# Patient Record
Sex: Male | Born: 1960 | Race: White | Hispanic: No | Marital: Married | Smoking: Never smoker
Health system: Southern US, Community
[De-identification: ages and names within clinical notes are randomized; demographics above are authoritative.]

## PROBLEM LIST (undated history)

## (undated) DIAGNOSIS — I10 Essential (primary) hypertension: Secondary | ICD-10-CM

## (undated) DIAGNOSIS — K219 Gastro-esophageal reflux disease without esophagitis: Secondary | ICD-10-CM

## (undated) DIAGNOSIS — K429 Umbilical hernia without obstruction or gangrene: Secondary | ICD-10-CM

## (undated) DIAGNOSIS — D126 Benign neoplasm of colon, unspecified: Secondary | ICD-10-CM

## (undated) HISTORY — PX: WISDOM TOOTH EXTRACTION: SHX21

## (undated) HISTORY — DX: Umbilical hernia without obstruction or gangrene: K42.9

---

## 2008-09-08 ENCOUNTER — Ambulatory Visit: Payer: Self-pay | Admitting: Internal Medicine

## 2009-09-05 HISTORY — PX: CARDIAC CATHETERIZATION: SHX172

## 2015-07-10 ENCOUNTER — Emergency Department
Admission: EM | Admit: 2015-07-10 | Discharge: 2015-07-10 | Disposition: A | Discharge status: Home / Self Care | Payer: BLUE CROSS/BLUE SHIELD | Attending: Emergency Medicine | Admitting: Emergency Medicine

## 2015-07-10 ENCOUNTER — Emergency Department: Payer: BLUE CROSS/BLUE SHIELD

## 2015-07-10 ENCOUNTER — Encounter: Payer: Self-pay | Admitting: *Deleted

## 2015-07-10 DIAGNOSIS — K429 Umbilical hernia without obstruction or gangrene: Secondary | ICD-10-CM | POA: Diagnosis present

## 2015-07-10 LAB — COMPREHENSIVE METABOLIC PANEL
ALBUMIN: 4.4 g/dL (ref 3.5–5.0)
ALT: 29 U/L (ref 17–63)
AST: 19 U/L (ref 15–41)
Alkaline Phosphatase: 61 U/L (ref 38–126)
Anion gap: 9 (ref 5–15)
BILIRUBIN TOTAL: 0.6 mg/dL (ref 0.3–1.2)
BUN: 19 mg/dL (ref 6–20)
CHLORIDE: 102 mmol/L (ref 101–111)
CO2: 28 mmol/L (ref 22–32)
CREATININE: 0.89 mg/dL (ref 0.61–1.24)
Calcium: 9.2 mg/dL (ref 8.9–10.3)
GFR calc Af Amer: >60 mL/min (ref >60)
GLUCOSE: 97 mg/dL (ref 65–99)
Potassium: 4 mmol/L (ref 3.5–5.1)
Sodium: 139 mmol/L (ref 135–145)
Total Protein: 7.6 g/dL (ref 6.5–8.1)

## 2015-07-10 LAB — CBC
HEMATOCRIT: 49.6 % (ref 40.0–52.0)
Hemoglobin: 16.5 g/dL (ref 13.0–18.0)
MCH: 28.6 pg (ref 26.0–34.0)
MCHC: 33.3 g/dL (ref 32.0–36.0)
MCV: 86 fL (ref 80.0–100.0)
Platelets: 246 10*3/uL (ref 150–440)
RBC: 5.76 MIL/uL (ref 4.40–5.90)
RDW: 13.6 % (ref 11.5–14.5)
WBC: 7.5 10*3/uL (ref 3.8–10.6)

## 2015-07-10 LAB — LIPASE, BLOOD: LIPASE: 32 U/L (ref 11–51)

## 2015-07-10 MED ORDER — IOHEXOL 300 MG/ML  SOLN
100.0000 mL | Freq: Once | INTRAMUSCULAR | Status: AC | PRN
  Administered 2015-07-10: 100 mL via INTRAVENOUS
  Filled 2015-07-10: qty 100

## 2015-07-10 MED ORDER — IOHEXOL 240 MG/ML SOLN
25.0000 mL | Freq: Once | INTRAMUSCULAR | Status: AC | PRN
  Administered 2015-07-10: 25 mL via ORAL
  Filled 2015-07-10: qty 25

## 2015-07-10 MED ORDER — ONDANSETRON HCL 4 MG PO TABS: 4.0000 mg | ORAL_TABLET | Freq: Every day | ORAL | Status: DC | PRN

## 2015-07-10 NOTE — Discharge Instructions (Signed)
Hernia, Adult A hernia is the bulging of an organ or tissue through a weak spot in the muscles of the abdomen (abdominal wall). Hernias develop most often near the navel or groin. There are many kinds of hernias. Common kinds include:  Femoral hernia. This kind of hernia develops under the groin in the upper thigh area.  Inguinal hernia. This kind of hernia develops in the groin or scrotum.  Umbilical hernia. This kind of hernia develops near the navel.  Hiatal hernia. This kind of hernia causes part of the stomach to be pushed up into the chest.  Incisional hernia. This kind of hernia bulges through a scar from an abdominal surgery. CAUSES This condition may be caused by:  Heavy lifting.  Coughing over a long period of time.  Straining to have a bowel movement.  An incision made during an abdominal surgery.  A birth defect (congenital defect).  Excess weight or obesity.  Smoking.  Poor nutrition.  Cystic fibrosis.  Excess fluid in the abdomen.  Undescended testicles. SYMPTOMS Symptoms of a hernia include:  A lump on the abdomen. This is the first sign of a hernia. The lump may become more obvious with standing, straining, or coughing. It may get bigger over time if it is not treated or if the condition causing it is not treated.  Pain. A hernia is usually painless, but it may become painful over time if treatment is delayed. The pain is usually dull and may get worse with standing or lifting heavy objects. Sometimes a hernia gets tightly squeezed in the weak spot (strangulated) or stuck there (incarcerated) and causes additional symptoms. These symptoms may include:  Vomiting.  Nausea.  Constipation.  Irritability. DIAGNOSIS A hernia may be diagnosed with:  A physical exam. During the exam your health care provider may ask you to cough or to make a specific movement, because a hernia is usually more visible when you move.  Imaging tests. These can  include:  X-rays.  Ultrasound.  CT scan. TREATMENT A hernia that is small and painless may not need to be treated. A hernia that is large or painful may be treated with surgery. Inguinal hernias may be treated with surgery to prevent incarceration or strangulation. Strangulated hernias are always treated with surgery, because lack of blood to the trapped organ or tissue can cause it to die. Surgery to treat a hernia involves pushing the bulge back into place and repairing the weak part of the abdomen. HOME CARE INSTRUCTIONS  Avoid straining.  Do not lift anything heavier than 10 lb (4.5 kg).  Lift with your leg muscles, not your back muscles. This helps avoid strain.  When coughing, try to cough gently.  Prevent constipation. Constipation leads to straining with bowel movements, which can make a hernia worse or cause a hernia repair to break down. You can prevent constipation by:  Eating a high-fiber diet that includes plenty of fruits and vegetables.  Drinking enough fluids to keep your urine clear or pale yellow. Aim to drink 6-8 glasses of water per day.  Using a stool softener as directed by your health care provider.  Lose weight, if you are overweight.  Do not use any tobacco products, including cigarettes, chewing tobacco, or electronic cigarettes. If you need help quitting, ask your health care provider.  Keep all follow-up visits as directed by your health care provider. This is important. Your health care provider may need to monitor your condition. SEEK MEDICAL CARE IF:  You have   swelling, redness, and pain in the affected area.  Your bowel habits change. SEEK IMMEDIATE MEDICAL CARE IF:  You have a fever.  You have abdominal pain that is getting worse.  You feel nauseous or you vomit.  You cannot push the hernia back in place by gently pressing on it while you are lying down.  The hernia:  Changes in shape or size.  Is stuck outside the  abdomen.  Becomes discolored.  Feels hard or tender.   This information is not intended to replace advice given to you by your health care provider. Make sure you discuss any questions you have with your health care provider.   Document Released: 08/22/2005 Document Revised: 09/12/2014 Document Reviewed: 07/02/2014 Elsevier Interactive Patient Education 2016 [employer]  

## 2015-07-10 NOTE — ED Notes (Signed)
Says sent by his doctor for possible incarcerated hernia-umbilical.  pcp in [place].

## 2015-07-10 NOTE — ED Notes (Signed)
Pt sent from PCP to R/O hernia, pt reports pain is nagging, pt denies any other symptoms

## 2015-07-10 NOTE — ED Provider Notes (Signed)
[facility] Emergency Department Provider Note  ____________________________________________  Time seen: 2 PM  I have reviewed the triage vital signs and the nursing notes.   HISTORY  Chief Complaint Hernia    HPI Evan Stein is a 54 y.o. male who presents with moderate, crampy, abdominal pain. Patient reports she has a history of a umbilical hernia and he has had worsening pain over the last week. He attempted to lift weights yesterday which made his pain worse. He called his physician this morning who recommended that he come to the emergency department to be evaluated. He reports nausea but no vomiting. No fevers. No history of abdominal surgery. Normal bowel movements.He feels better when he lies on his back     History reviewed. No pertinent past medical history.  There are no active problems to display for this patient.   Past Surgical History  Procedure Laterality Date  . Cardiac catheterization      No current outpatient prescriptions on file.  Allergies Review of patient's allergies indicates no known allergies.  No family history on file.  Social History Social History  Substance Use Topics  . Smoking status: Never Smoker   . Smokeless tobacco: None  . Alcohol Use: No    Review of Systems  Constitutional: Negative for fever. Eyes: Negative for visual changes. ENT: Negative for sore throat Cardiovascular: Negative for chest pain. Respiratory: Negative for shortness of breath. Gastrointestinal: Positive for abdominal pain Genitourinary: Negative for dysuria. Musculoskeletal: Negative for back pain. Skin: Negative for rash. Neurological: Negative for headaches or focal weakness Psychiatric: No anxiety    ____________________________________________   PHYSICAL EXAM:  VITAL SIGNS: ED Triage Vitals  Enc Vitals Group     BP 07/10/15 1147 136/73 mmHg     Pulse Rate 07/10/15 1147 72     Resp 07/10/15 1147 18      Temp 07/10/15 1147 97.7 F (36.5 C)     Temp Source 07/10/15 1147 Oral     SpO2 07/10/15 1147 99 %     Weight 07/10/15 1147 250 lb (113.399 kg)     Height 07/10/15 1147 6\' 2"  (1.88 m)     Head Cir --      Peak Flow --      Pain Score 07/10/15 1155 3     Pain Loc --      Pain Edu? --      Excl. in [place]? --      Constitutional: Alert and oriented. Well appearing and in no distress. Eyes: Conjunctivae are normal.  ENT   Head: Normocephalic and atraumatic.   Mouth/Throat: Mucous membranes are moist. Cardiovascular: Normal rate, regular rhythm. Normal and symmetric distal pulses are present in all extremities. No murmurs, rubs, or gallops. Respiratory: Normal respiratory effort without tachypnea nor retractions. Breath sounds are clear and equal bilaterally.  Gastrointestinal: Mild tenderness to palpation around the umbilicus. Positive umbilical hernia which is reducible but does cause some discomfort when palpated. No distention. There is no CVA tenderness. Genitourinary: deferred Musculoskeletal: Nontender with normal range of motion in all extremities. No lower extremity tenderness nor edema. Neurologic:  Normal speech and language. No gross focal neurologic deficits are appreciated. Skin:  Skin is warm, dry and intact. No rash noted. Psychiatric: Mood and affect are normal. Patient exhibits appropriate insight and judgment.  ____________________________________________    LABS (pertinent positives/negatives)  Labs Reviewed  CBC  COMPREHENSIVE METABOLIC PANEL  LIPASE, BLOOD    ____________________________________________   EKG  None  ____________________________________________    RADIOLOGY I have personally reviewed any xrays that were ordered on this patient: CT abdomen and pelvis shows no acute distress  ____________________________________________   PROCEDURES  Procedure(s) performed: none  Critical Care performed:  none  ____________________________________________   INITIAL IMPRESSION / ASSESSMENT AND PLAN / ED COURSE  Pertinent labs & imaging results that were available during my care of the patient were reviewed by me and considered in my medical decision making (see chart for details).  Patient with an umbilical hernia and some tenderness to palpation in the area. His labs are unremarkable. We will obtain a CT scan given his tenderness to palpation to further evaluate  CT unremarkable, fat containing umbilical hernia. I'll refer the patient to general surgery. Return precautions discussed at length  ____________________________________________   FINAL CLINICAL IMPRESSION(S) / ED DIAGNOSES  Final diagnoses:  Umbilical hernia, recurrence not specified     Lavonia Drafts, MD 07/10/15 2005

## 2015-07-16 ENCOUNTER — Encounter: Payer: Self-pay | Admitting: Surgery

## 2015-07-16 ENCOUNTER — Telehealth: Payer: Self-pay | Admitting: Surgery

## 2015-07-16 ENCOUNTER — Ambulatory Visit (INDEPENDENT_AMBULATORY_CARE_PROVIDER_SITE_OTHER): Payer: BLUE CROSS/BLUE SHIELD | Admitting: Surgery

## 2015-07-16 VITALS — BP 158/92 | HR 76 | Temp 98.4°F | Ht 74.0 in | Wt 249.0 lb

## 2015-07-16 DIAGNOSIS — K429 Umbilical hernia without obstruction or gangrene: Secondary | ICD-10-CM | POA: Diagnosis not present

## 2015-07-16 NOTE — Patient Instructions (Signed)
Angie will be contacting you with your surgery date later on today or tomorrow.

## 2015-07-16 NOTE — Progress Notes (Signed)
  Surgical Consultation  07/16/2015  Evan Stein is an 54 y.o. male.   CC: Umbilical hernia  HPI: This a patient with a known umbilical hernia which was small and then he ended up in the emergency room last week where an incarcerated hernia was identified and reduced without difficulty. He's been having abdominal pain nausea and constipation over the last few weeks. He denies fevers or chills no weight loss. He's never had a colonoscopy before. He points the area in the periumbilical area and no right upper quadrant pain.  Past Medical History  Diagnosis Date  . Umbilical hernia     Past Surgical History  Procedure Laterality Date  . Cardiac catheterization      Family History  Problem Relation Age of Onset  . Hypertension Father     Social History:  reports that he has never smoked. He does not have any smokeless tobacco history on file. He reports that he does not drink alcohol or use illicit drugs.  Allergies: No Known Allergies  Medications reviewed.   Review of Systems:   Review of Systems  Constitutional: Negative.   HENT: Negative.   Eyes: Negative.   Respiratory: Negative.   Cardiovascular: Negative.   Gastrointestinal: Positive for nausea, abdominal pain and constipation. Negative for heartburn, vomiting, diarrhea, blood in stool and melena.  Genitourinary: Negative.   Musculoskeletal: Negative.   Skin: Negative.   Neurological: Negative.   Endo/Heme/Allergies: Negative.   Psychiatric/Behavioral: Negative.      Physical Exam:  BP 158/92 mmHg  Pulse 76  Temp(Src) 98.4 F (36.9 C) (Oral)  Ht 6\' 2"  (1.88 m)  Wt 249 lb (112.946 kg)  BMI 31.96 kg/m2  Physical Exam  Constitutional: He is oriented to person, place, and time and well-developed, well-nourished, and in no distress. No distress.  HENT:  Head: Normocephalic and atraumatic.  Eyes: Pupils are equal, round, and reactive to light. Right eye exhibits no discharge. Left eye exhibits no  discharge. No scleral icterus.  Neck: Normal range of motion.  Cardiovascular: Normal rate, regular rhythm and normal heart sounds.   Pulmonary/Chest: Effort normal and breath sounds normal. No respiratory distress. He has no wheezes. He has no rales.  Abdominal: Soft. He exhibits no distension. There is no tenderness. There is no rebound and no guarding.  Large reducible umbilical hernia without tenderness  Musculoskeletal: Normal range of motion. He exhibits no edema.  Lymphadenopathy:    He has no cervical adenopathy.  Neurological: He is alert and oriented to person, place, and time.  Skin: Skin is warm and dry. No erythema.  Psychiatric: Mood, affect and judgment normal.      No results found for this or any previous visit (from the past 48 hour(s)). No results found.  Assessment/Plan:  Umbilical hernia with a history of incarceration currently does not incarcerated and causing him minimal problems. He will need a colonoscopy due to his change in bowel habits but I have recommended repair of his umbilical hernia likely with mesh. Rationale for this been discussed the procedure is been described. The risks of bleeding infection recurrence mesh placement mesh infection and cosmetic deformity have all been discussed he understood and agreed to proceed  Florene Glen, MD, FACS

## 2015-07-16 NOTE — Telephone Encounter (Signed)
Pt advised of pre op date/time and sx date. Sx: 07/27/15 with Dr Burt Knack for umbilical hernia repair with mesh (open) Pre op: 07/22/15 @ 11:45am.

## 2015-07-22 ENCOUNTER — Encounter
Admission: RE | Admit: 2015-07-22 | Discharge: 2015-07-22 | Disposition: A | Discharge status: Home / Self Care | Payer: BLUE CROSS/BLUE SHIELD | Source: Ambulatory Visit | Attending: Surgery | Admitting: Surgery

## 2015-07-22 NOTE — Patient Instructions (Signed)
  Your procedure is scheduled on: Monday 07/27/2015 Report to Day Surgery. 2ND FLOOR MEDICAL MALL ENTRANCE To find out your arrival time please call [phone] between 1PM - 3PM on Friday 07/24/2015.  Remember: Instructions that are not followed completely may result in serious medical risk, up to and including death, or upon the discretion of your surgeon and anesthesiologist your surgery may need to be rescheduled.    __X__ 1. Do not eat food or drink liquids after midnight. No gum chewing or hard candies.     __X__ 2. No Alcohol for 24 hours before or after surgery.   ____ 3. Bring all medications with you on the day of surgery if instructed.    __X__ 4. Notify your doctor if there is any change in your medical condition     (cold, fever, infections).     Do not wear jewelry, make-up, hairpins, clips or nail polish.  Do not wear lotions, powders, or perfumes.  Do not shave 48 hours prior to surgery. Men may shave face and neck.  Do not bring valuables to the hospital.    [facility] is not responsible for any belongings or valuables.               Contacts, dentures or bridgework may not be worn into surgery.  Leave your suitcase in the car. After surgery it may be brought to your room.  For patients admitted to the hospital, discharge time is determined by your                treatment team.   Patients discharged the day of surgery will not be allowed to drive home.   Please read over the following fact sheets that you were given:   Surgical Site Infection Prevention   ____ Take these medicines the morning of surgery with A SIP OF WATER:    1.   2.   3.   4.  5.  6.  ____ Fleet Enema (as directed)   ____ Use CHG Soap as directed  ____ Use inhalers on the day of surgery  ____ Stop metformin 2 days prior to surgery    ____ Take 1/2 of usual insulin dose the night before surgery and none on the morning of surgery.   __X__ Stop Coumadin/Plavix/aspirin on  TODAY  ____ Stop Anti-inflammatories on    __X__ Stop supplements until after surgery.    TODAY VITAMIN C, GINSENG, TRIBULUS, FISH OIL, VITAMIN E  ____ Bring C-Pap to the hospital.

## 2015-07-27 ENCOUNTER — Encounter: Payer: Self-pay | Admitting: *Deleted

## 2015-07-27 ENCOUNTER — Ambulatory Visit: Payer: BLUE CROSS/BLUE SHIELD | Admitting: Certified Registered Nurse Anesthetist

## 2015-07-27 ENCOUNTER — Ambulatory Visit
Admission: RE | Admit: 2015-07-27 | Discharge: 2015-07-27 | Disposition: A | Discharge status: Home / Self Care | Payer: BLUE CROSS/BLUE SHIELD | Source: Ambulatory Visit | Attending: Surgery | Admitting: Surgery

## 2015-07-27 ENCOUNTER — Encounter
Admission: RE | Disposition: A | Discharge status: Home / Self Care | Payer: Self-pay | Source: Ambulatory Visit | Attending: Surgery

## 2015-07-27 DIAGNOSIS — Z8249 Family history of ischemic heart disease and other diseases of the circulatory system: Secondary | ICD-10-CM | POA: Diagnosis not present

## 2015-07-27 DIAGNOSIS — K429 Umbilical hernia without obstruction or gangrene: Secondary | ICD-10-CM | POA: Diagnosis not present

## 2015-07-27 HISTORY — PX: UMBILICAL HERNIA REPAIR: SHX196

## 2015-07-27 SURGERY — REPAIR, HERNIA, UMBILICAL, ADULT
Anesthesia: General | Wound class: Clean

## 2015-07-27 MED ORDER — BUPIVACAINE-EPINEPHRINE (PF) 0.25% -1:200000 IJ SOLN
INTRAMUSCULAR | Status: AC
  Filled 2015-07-27: qty 30

## 2015-07-27 MED ORDER — LIDOCAINE HCL (CARDIAC) 20 MG/ML IV SOLN
INTRAVENOUS | Status: DC | PRN
  Administered 2015-07-27: 100 mg via INTRAVENOUS

## 2015-07-27 MED ORDER — PROPOFOL 10 MG/ML IV BOLUS
INTRAVENOUS | Status: DC | PRN
  Administered 2015-07-27: 200 mg via INTRAVENOUS

## 2015-07-27 MED ORDER — CEFAZOLIN SODIUM-DEXTROSE 2-3 GM-% IV SOLR
2.0000 g | INTRAVENOUS | Status: AC
  Administered 2015-07-27: 2 g via INTRAVENOUS

## 2015-07-27 MED ORDER — HEPARIN SODIUM (PORCINE) 5000 UNIT/ML IJ SOLN
5000.0000 [IU] | Freq: Once | INTRAMUSCULAR | Status: AC
  Administered 2015-07-27: 5000 [IU] via SUBCUTANEOUS

## 2015-07-27 MED ORDER — DEXAMETHASONE SODIUM PHOSPHATE 4 MG/ML IJ SOLN
INTRAMUSCULAR | Status: DC | PRN
  Administered 2015-07-27: 4 mg via INTRAVENOUS

## 2015-07-27 MED ORDER — ONDANSETRON HCL 4 MG/2ML IJ SOLN
INTRAMUSCULAR | Status: DC | PRN
  Administered 2015-07-27: 4 mg via INTRAVENOUS

## 2015-07-27 MED ORDER — LACTATED RINGERS IV SOLN
INTRAVENOUS | Status: DC
  Administered 2015-07-27: 13:00:00 via INTRAVENOUS

## 2015-07-27 MED ORDER — FENTANYL CITRATE (PF) 100 MCG/2ML IJ SOLN
INTRAMUSCULAR | Status: AC
  Administered 2015-07-27: 25 ug via INTRAVENOUS
  Filled 2015-07-27: qty 2

## 2015-07-27 MED ORDER — ROCURONIUM BROMIDE 100 MG/10ML IV SOLN
INTRAVENOUS | Status: DC | PRN
  Administered 2015-07-27: 5 mg via INTRAVENOUS
  Administered 2015-07-27: 10 mg via INTRAVENOUS

## 2015-07-27 MED ORDER — FAMOTIDINE 20 MG PO TABS
20.0000 mg | ORAL_TABLET | Freq: Once | ORAL | Status: AC
  Administered 2015-07-27: 20 mg via ORAL

## 2015-07-27 MED ORDER — OXYCODONE-ACETAMINOPHEN 5-325 MG PO TABS: 1.0000 | ORAL_TABLET | ORAL | Status: DC | PRN

## 2015-07-27 MED ORDER — ACETAMINOPHEN 10 MG/ML IV SOLN
INTRAVENOUS | Status: AC
  Filled 2015-07-27: qty 100

## 2015-07-27 MED ORDER — GLYCOPYRROLATE 0.2 MG/ML IJ SOLN
INTRAMUSCULAR | Status: DC | PRN
  Administered 2015-07-27: 0.6 mg via INTRAVENOUS

## 2015-07-27 MED ORDER — FAMOTIDINE 20 MG PO TABS
ORAL_TABLET | ORAL | Status: AC
  Filled 2015-07-27: qty 1

## 2015-07-27 MED ORDER — HEPARIN SODIUM (PORCINE) 5000 UNIT/ML IJ SOLN
INTRAMUSCULAR | Status: AC
  Filled 2015-07-27: qty 1

## 2015-07-27 MED ORDER — CEFAZOLIN SODIUM-DEXTROSE 2-3 GM-% IV SOLR
INTRAVENOUS | Status: AC
  Filled 2015-07-27: qty 50

## 2015-07-27 MED ORDER — FENTANYL CITRATE (PF) 100 MCG/2ML IJ SOLN
INTRAMUSCULAR | Status: DC | PRN
  Administered 2015-07-27: 100 ug via INTRAVENOUS

## 2015-07-27 MED ORDER — PROMETHAZINE HCL 25 MG/ML IJ SOLN: 6.2500 mg | INTRAMUSCULAR | Status: DC | PRN

## 2015-07-27 MED ORDER — MIDAZOLAM HCL 2 MG/2ML IJ SOLN
INTRAMUSCULAR | Status: DC | PRN
  Administered 2015-07-27: 2 mg via INTRAVENOUS

## 2015-07-27 MED ORDER — FENTANYL CITRATE (PF) 100 MCG/2ML IJ SOLN
25.0000 ug | INTRAMUSCULAR | Status: DC | PRN
  Administered 2015-07-27: 25 ug via INTRAVENOUS
  Administered 2015-07-27: 50 ug via INTRAVENOUS

## 2015-07-27 MED ORDER — BUPIVACAINE-EPINEPHRINE 0.25% -1:200000 IJ SOLN
INTRAMUSCULAR | Status: DC | PRN
  Administered 2015-07-27: 30 mL

## 2015-07-27 MED ORDER — SUCCINYLCHOLINE CHLORIDE 20 MG/ML IJ SOLN
INTRAMUSCULAR | Status: DC | PRN
  Administered 2015-07-27: 140 mg via INTRAVENOUS

## 2015-07-27 MED ORDER — NEOSTIGMINE METHYLSULFATE 10 MG/10ML IV SOLN
INTRAVENOUS | Status: DC | PRN
  Administered 2015-07-27: 5 mg via INTRAVENOUS

## 2015-07-27 MED ORDER — ACETAMINOPHEN 10 MG/ML IV SOLN
INTRAVENOUS | Status: DC | PRN
  Administered 2015-07-27: 1000 mg via INTRAVENOUS

## 2015-07-27 SURGICAL SUPPLY — 27 items
ADHESIVE MASTISOL STRL (MISCELLANEOUS) ×3 IMPLANT
CANISTER SUCT 1200ML W/VALVE (MISCELLANEOUS) ×3 IMPLANT
CLOSURE WOUND 1/2 X4 (GAUZE/BANDAGES/DRESSINGS) ×1
DRAPE LAPAROTOMY 100X77 ABD (DRAPES) ×3 IMPLANT
GAUZE SPONGE 4X4 12PLY STRL (GAUZE/BANDAGES/DRESSINGS) ×3 IMPLANT
GLOVE BIO SURGEON STRL SZ8 (GLOVE) ×6 IMPLANT
GOWN STRL REUS W/ TWL LRG LVL3 (GOWN DISPOSABLE) ×2 IMPLANT
GOWN STRL REUS W/TWL LRG LVL3 (GOWN DISPOSABLE) ×4
LABEL OR SOLS (LABEL) ×3 IMPLANT
MASK SURG TIE (MASK) ×3 IMPLANT
MESH VENTRALEX ST 1-7/10 CRC S (Mesh General) ×3 IMPLANT
NDL SAFETY 22GX1.5 (NEEDLE) ×3 IMPLANT
NS IRRIG 500ML POUR BTL (IV SOLUTION) ×3 IMPLANT
PACK BASIN MINOR ARMC (MISCELLANEOUS) ×3 IMPLANT
PAD GROUND ADULT SPLIT (MISCELLANEOUS) ×3 IMPLANT
SPONGE LAP 18X18 5 PK (GAUZE/BANDAGES/DRESSINGS) ×3 IMPLANT
STRIP CLOSURE SKIN 1/2X4 (GAUZE/BANDAGES/DRESSINGS) ×2 IMPLANT
SUT ETHIBOND 0 (SUTURE) IMPLANT
SUT ETHIBOND NAB CT1 #1 30IN (SUTURE) IMPLANT
SUT MNCRL 4-0 (SUTURE) ×2
SUT MNCRL 4-0 27XMFL (SUTURE) ×1
SUT PROLENE 0 CT 1 30 (SUTURE) ×9 IMPLANT
SUT PROLENE 1 CT 1 30 (SUTURE) IMPLANT
SUT VIC AB 3-0 SH 27 (SUTURE)
SUT VIC AB 3-0 SH 27X BRD (SUTURE) IMPLANT
SUTURE MNCRL 4-0 27XMF (SUTURE) ×1 IMPLANT
SYRINGE 10CC LL (SYRINGE) ×3 IMPLANT

## 2015-07-27 NOTE — Anesthesia Preprocedure Evaluation (Signed)
Anesthesia Evaluation  Patient identified by MRN, date of birth, ID band Patient awake    Reviewed: Allergy & Precautions, H&P , NPO status , Patient's Chart, lab work & pertinent test results, reviewed documented beta blocker date and time   History of Anesthesia Complications Negative for: history of anesthetic complications  Airway Mallampati: II  TM Distance: >3 FB Neck ROM: full    Dental no notable dental hx. (+) Teeth Intact, Chipped Bottom two central incisors bonded :   Pulmonary neg shortness of breath, sleep apnea (suggestive history) , neg COPD, neg recent URI,    Pulmonary exam normal breath sounds clear to auscultation       Cardiovascular Exercise Tolerance: Good negative cardio ROS Normal cardiovascular exam Rhythm:regular Rate:Normal     Neuro/Psych negative neurological ROS  negative psych ROS   GI/Hepatic negative GI ROS, Neg liver ROS,   Endo/Other  negative endocrine ROS  Renal/GU negative Renal ROS  negative genitourinary   Musculoskeletal   Abdominal   Peds  Hematology negative hematology ROS (+)   Anesthesia Other Findings Past Medical History:   Umbilical hernia                                             Reproductive/Obstetrics negative OB ROS                             Anesthesia Physical Anesthesia Plan  ASA: II  Anesthesia Plan: General   Post-op Pain Management:    Induction:   Airway Management Planned:   Additional Equipment:   Intra-op Plan:   Post-operative Plan:   Informed Consent: I have reviewed the patients History and Physical, chart, labs and discussed the procedure including the risks, benefits and alternatives for the proposed anesthesia with the patient or authorized representative who has indicated his/her understanding and acceptance.   Dental Advisory Given  Plan Discussed with: Anesthesiologist, CRNA and  Surgeon  Anesthesia Plan Comments:         Anesthesia Quick Evaluation

## 2015-07-27 NOTE — Discharge Instructions (Signed)
Remove dressing in 24 hours. °May shower in 24 hours. °Leave paper strips in place. °Resume all home medications. °Follow-up with Dr. Cooper in 10 days.AMBULATORY SURGERY  °DISCHARGE INSTRUCTIONS ° ° °1) The drugs that you were given will stay in your system until tomorrow so for the next 24 hours you should not: ° °A) Drive an automobile °B) Make any legal decisions °C) Drink any alcoholic beverage ° ° °2) You may resume regular meals tomorrow.  Today it is better to start with liquids and gradually work up to solid foods. ° °You may eat anything you prefer, but it is better to start with liquids, then soup and crackers, and gradually work up to solid foods. ° ° °3) Please notify your doctor immediately if you have any unusual bleeding, trouble breathing, redness and pain at the surgery site, drainage, fever, or pain not relieved by medication. ° ° ° °4) Additional Instructions: ° ° ° ° ° ° ° °Please contact your physician with any problems or Same Day Surgery at [phone], Monday through Friday 6 am to 4 pm, or [place] [facility] [place] [facility] number at [phone].AMBULATORY SURGERY  °DISCHARGE INSTRUCTIONS ° ° °5) The drugs that you were given will stay in your system until tomorrow so for the next 24 hours you should not: ° °D) Drive an automobile °E) Make any legal decisions °F) Drink any alcoholic beverage ° ° °6) You may resume regular meals tomorrow.  Today it is better to start with liquids and gradually work up to solid foods. ° °You may eat anything you prefer, but it is better to start with liquids, then soup and crackers, and gradually work up to solid foods. ° ° °7) Please notify your doctor immediately if you have any unusual bleeding, trouble breathing, redness and pain at the surgery site, drainage, fever, or pain not relieved by medication. ° ° ° °8) Additional Instructions: ° ° ° ° ° ° ° °Please contact your physician with any problems or Same Day Surgery at [phone], Monday through Friday 6  am to 4 pm, or [place] [facility] [place] Main number at [phone]. °

## 2015-07-27 NOTE — Transfer of Care (Signed)
Immediate Anesthesia Transfer of Care Note  Patient: Evan Stein  Procedure(s) Performed: Procedure(s): HERNIA REPAIR UMBILICAL ADULT (N/A)  Patient Location: PACU  Anesthesia Type:General  Level of Consciousness: awake and alert   Airway & Oxygen Therapy: Patient Spontanous Breathing  Post-op Assessment: Report given to RN and Post -op Vital signs reviewed and stable  Post vital signs: Reviewed and stable  Last Vitals:  Filed Vitals:   07/27/15 1258  BP: 173/113  Pulse: 71  Temp: 37.2 C  Resp: 18    Complications: No apparent anesthesia complications

## 2015-07-27 NOTE — Progress Notes (Signed)
Preoperative Review   Patient is met in the preoperative holding area. The history is reviewed in the chart and with the patient. I personally reviewed the options and rationale as well as the risks of this procedure that have been previously discussed with the patient. All questions asked by the patient and/or family were answered to their satisfaction.  Patient agrees to proceed with this procedure at this time.  Florene Glen M.D. FACS

## 2015-07-27 NOTE — Anesthesia Procedure Notes (Signed)
Procedure Name: Intubation Date/Time: 07/27/2015 2:19 PM Performed by: Johnna Acosta Pre-anesthesia Checklist: Patient identified, Emergency Drugs available, Suction available and Patient being monitored Patient Re-evaluated:Patient Re-evaluated prior to inductionOxygen Delivery Method: Circle system utilized Preoxygenation: Pre-oxygenation with 100% oxygen Intubation Type: IV induction Ventilation: Oral airway inserted - appropriate to patient size and Two handed mask ventilation required Laryngoscope Size: Miller and 2 Grade View: Grade I Tube type: Oral Tube size: 7.5 mm Number of attempts: 1 Placement Confirmation: ETT inserted through vocal cords under direct vision and positive ETCO2 Secured at: 22 cm Tube secured with: Tape Dental Injury: Teeth and Oropharynx as per pre-operative assessment

## 2015-07-27 NOTE — Op Note (Signed)
Abdominal Hernia Repair  Pre-operative Diagnosis: UH  Post-operative Diagnosis: UH  Surgeon: Jerrol Banana. Burt Knack, MD FACS  Anesthesia: Gen. with endotracheal tube  Assistant: Surgical tech  Procedure Details  The patient was seen again in the Holding Room. The benefits, complications, treatment options, and expected outcomes were discussed with the patient. The risks of bleeding, infection, recurrence of symptoms, failure to resolve symptoms, bowel injury, mesh placement, mesh infection, any of which could require further surgery were reviewed with the patient. The likelihood of improving the patient's symptoms with return to their baseline status is good.  The patient and/or family concurred with the proposed plan, giving informed consent.  The patient was taken to Operating Room, identified as Evan Stein and the procedure verified.  A Time Out was held and the above information confirmed.  Prior to the induction of general anesthesia, antibiotic prophylaxis was administered. VTE prophylaxis was in place. General endotracheal anesthesia was then administered and tolerated well. After the induction, the abdomen was prepped with Chloraprep and draped in the sterile fashion. The patient was positioned in the supine position.  A curvilinear incision was made in the infraumbilical area and dissection down to the fascia was performed. The hernia sac was opened and reduced the fascial edges were cleaned. The hernia rent measuring approximately 1/2 cm. A 4 cm ventral X patch was placed into the abdominal cavity and held in with UU sutures and figure-of-eight sutures of 0 Prolene. The fascia was closed over the mesh adequately as the tails were trimmed. Additional Marcaine was placed for a total of 30 cc and then the wound was closed by first tacking the skin of the umbilicus to the fascia with 30 Vicryls and then deep sutures of 2-0 Vicryl in 2 layers were placed to adequately cover the fascia. 4-0  subcuticular Monocryl was used all skin edges Steri-Strips Mastisol and sterile dressings were placed    Findings:  Small umbilical hernia  Estimated Blood Loss: Minimal         Drains: None         Specimens: None       Complications: none               Brittany Osier E. Burt Knack, MD, FACS

## 2015-07-28 ENCOUNTER — Telehealth: Payer: Self-pay

## 2015-07-28 NOTE — Anesthesia Postprocedure Evaluation (Signed)
Anesthesia Post Note  Patient: Evan Stein  Procedure(s) Performed: Procedure(s) (LRB): HERNIA REPAIR UMBILICAL ADULT (N/A)  Patient location during evaluation: PACU Anesthesia Type: General Level of consciousness: awake and alert Pain management: pain level controlled Vital Signs Assessment: post-procedure vital signs reviewed and stable Respiratory status: spontaneous breathing, nonlabored ventilation, respiratory function stable and patient connected to nasal cannula oxygen Cardiovascular status: blood pressure returned to baseline and stable Postop Assessment: No signs of nausea or vomiting Anesthetic complications: no    Last Vitals:  Filed Vitals:   07/27/15 1554 07/27/15 1634  BP: 143/95 153/82  Pulse: 77 65  Temp: 36.3 C   Resp: 16     Last Pain:  Filed Vitals:   07/28/15 0822  PainSc: 2                  Martha Clan

## 2015-07-28 NOTE — Telephone Encounter (Signed)
Post discharge call to patient made at this time. Patient states that his abdomen is feeling better than he expected. Pain is controlled at this time with pain medications. No questions or concerns.   Confirmed patient appointment scheduled. Encouraged patient to call with any questions that arise prior to appointment.  Encouraged to call with any further questions or concerns.

## 2015-07-28 NOTE — Telephone Encounter (Signed)
Spoke with patient's wife at this time. She states that patient is hurting more than he was, he has broke out into a sweat, is breathing faster than normal , and is feeling like he is going to pass out. Denies fever, nausea, vomiting.  Explained that this is probably a response to pain and not having pain medication in his system. Informed patient's wife that I encourage him to take his pain medication if he is in pain to help with deep breathing and help him to feel better. Explained that if this is a pain response, his symptoms should improve within 45 minutes - 1 hour. If this is not the case and he persists to have these same symptoms, I need her to call me back and we will need to potentially send patient to the Emergency Room.

## 2015-07-29 NOTE — Telephone Encounter (Signed)
Called patient once again this morning to find out how he is since episode yesterday.   He states that after he took the pain medication, all symptoms subsided and he is much better than he was yesterday.  Encouraged to call with any further questions or concerns.

## 2015-08-03 ENCOUNTER — Encounter: Payer: Self-pay | Admitting: Surgery

## 2015-08-06 ENCOUNTER — Encounter: Payer: Self-pay | Admitting: Surgery

## 2015-08-06 ENCOUNTER — Telehealth: Payer: Self-pay | Admitting: *Deleted

## 2015-08-06 ENCOUNTER — Ambulatory Visit (INDEPENDENT_AMBULATORY_CARE_PROVIDER_SITE_OTHER): Payer: BLUE CROSS/BLUE SHIELD | Admitting: Surgery

## 2015-08-06 VITALS — BP 166/98 | HR 81 | Temp 97.6°F | Wt 254.0 lb

## 2015-08-06 DIAGNOSIS — K420 Umbilical hernia with obstruction, without gangrene: Secondary | ICD-10-CM

## 2015-08-06 DIAGNOSIS — K429 Umbilical hernia without obstruction or gangrene: Secondary | ICD-10-CM | POA: Insufficient documentation

## 2015-08-06 NOTE — Patient Instructions (Signed)
Follow up in our office as needed. You will receive a call from Dr Dorothey Baseman nurse to schedule your colonoscopy. If you have any questions give our office call.

## 2015-08-06 NOTE — Progress Notes (Signed)
54yr old s/p umbilical hernia repair.  Patient states doing well.  Pain controlled with Po meds.  He is eating and drinking well.  He has had some constipation but is having BMs.    Filed Vitals:   08/06/15 1538  BP: 166/98  Pulse: 81  Temp: 97.6 F (36.4 C)   PE:  Gen; NAD Abd: soft, nt, nd, umbilical incision c/d/i  A/P doing well postop, will continue no lifting over about 15 lbs for 5 more weeks, work note given. He is also having a apt scheduled for colonoscopy given family history.

## 2015-08-06 NOTE — Telephone Encounter (Signed)
Patient needs to be set up for a colonoscopy after 08/24/15 as per Dr Burt Knack.

## 2015-08-07 ENCOUNTER — Other Ambulatory Visit: Payer: Self-pay

## 2015-08-07 NOTE — Telephone Encounter (Signed)
Pt has been scheduled for a colonoscopy at [facility] on 08/27/15. Instructions/rx mailed.

## 2015-08-18 ENCOUNTER — Telehealth: Payer: Self-pay

## 2015-08-18 NOTE — Telephone Encounter (Signed)
Patient scheduled for Colonoscopy on 12/22, please schedule patient to follow up with Dr. Azalee Course- to go over Colonoscopy results. Week of 1/4-1/7.

## 2015-08-19 NOTE — Telephone Encounter (Signed)
Patient has an appointment on 09/08/14 @ 10:30am with Dr Azalee Course in [place] Location.

## 2015-08-24 ENCOUNTER — Telehealth: Payer: Self-pay | Admitting: Surgery

## 2015-08-24 NOTE — Telephone Encounter (Signed)
Patient has called and stated that he believes the sutures may be coming out from the incision from his umbilical hernia repair done on 07/27/15 with Dr Burt Knack. Patient just would like to make sure it is a suture. He will be at the [facility] location on 08/27/15 for a colonoscopy if he may need to come in to have it checked. Please call patient to advise him.

## 2015-08-24 NOTE — Telephone Encounter (Signed)
I have called patient back to make a nurse visit for him. No answer. I was able to leave a detailed message on voicemail stating that if he would like, he could call our office to make a nurse visit at his convenience in [place].

## 2015-08-24 NOTE — Telephone Encounter (Signed)
Just have patient stop by for a nurse visit sometime this week at his convenience in [place] please.

## 2015-08-25 ENCOUNTER — Other Ambulatory Visit: Payer: Self-pay

## 2015-08-25 NOTE — Discharge Instructions (Signed)

## 2015-08-27 ENCOUNTER — Ambulatory Visit
Admission: RE | Admit: 2015-08-27 | Discharge: 2015-08-27 | Disposition: A | Discharge status: Home / Self Care | Payer: BLUE CROSS/BLUE SHIELD | Source: Ambulatory Visit | Attending: Gastroenterology | Admitting: Gastroenterology

## 2015-08-27 ENCOUNTER — Ambulatory Visit: Payer: BLUE CROSS/BLUE SHIELD | Admitting: Anesthesiology

## 2015-08-27 ENCOUNTER — Other Ambulatory Visit: Payer: Self-pay | Admitting: Gastroenterology

## 2015-08-27 ENCOUNTER — Encounter
Admission: RE | Disposition: A | Discharge status: Home / Self Care | Payer: Self-pay | Source: Ambulatory Visit | Attending: Gastroenterology

## 2015-08-27 ENCOUNTER — Encounter: Payer: Self-pay | Admitting: Anesthesiology

## 2015-08-27 DIAGNOSIS — I10 Essential (primary) hypertension: Secondary | ICD-10-CM | POA: Diagnosis not present

## 2015-08-27 DIAGNOSIS — Z1211 Encounter for screening for malignant neoplasm of colon: Secondary | ICD-10-CM | POA: Diagnosis not present

## 2015-08-27 DIAGNOSIS — Z7982 Long term (current) use of aspirin: Secondary | ICD-10-CM | POA: Insufficient documentation

## 2015-08-27 DIAGNOSIS — Z79899 Other long term (current) drug therapy: Secondary | ICD-10-CM | POA: Diagnosis not present

## 2015-08-27 DIAGNOSIS — D126 Benign neoplasm of colon, unspecified: Secondary | ICD-10-CM

## 2015-08-27 DIAGNOSIS — D120 Benign neoplasm of cecum: Secondary | ICD-10-CM | POA: Diagnosis not present

## 2015-08-27 DIAGNOSIS — R194 Change in bowel habit: Secondary | ICD-10-CM | POA: Insufficient documentation

## 2015-08-27 DIAGNOSIS — Z8249 Family history of ischemic heart disease and other diseases of the circulatory system: Secondary | ICD-10-CM | POA: Insufficient documentation

## 2015-08-27 DIAGNOSIS — D124 Benign neoplasm of descending colon: Secondary | ICD-10-CM | POA: Diagnosis not present

## 2015-08-27 DIAGNOSIS — D123 Benign neoplasm of transverse colon: Secondary | ICD-10-CM

## 2015-08-27 HISTORY — PX: COLONOSCOPY WITH PROPOFOL: SHX5780

## 2015-08-27 HISTORY — DX: Essential (primary) hypertension: I10

## 2015-08-27 SURGERY — COLONOSCOPY WITH PROPOFOL
Anesthesia: Monitor Anesthesia Care

## 2015-08-27 MED ORDER — LACTATED RINGERS IV SOLN
INTRAVENOUS | Status: DC
  Administered 2015-08-27: 08:00:00 via INTRAVENOUS

## 2015-08-27 MED ORDER — LIDOCAINE HCL (CARDIAC) 20 MG/ML IV SOLN
INTRAVENOUS | Status: DC | PRN
  Administered 2015-08-27: 50 mg via INTRAVENOUS

## 2015-08-27 MED ORDER — STERILE WATER FOR IRRIGATION IR SOLN
Status: DC | PRN
  Administered 2015-08-27: 09:00:00

## 2015-08-27 MED ORDER — PROPOFOL 10 MG/ML IV BOLUS
INTRAVENOUS | Status: DC | PRN
  Administered 2015-08-27 (×2): 30 mg via INTRAVENOUS
  Administered 2015-08-27: 40 mg via INTRAVENOUS
  Administered 2015-08-27 (×2): 20 mg via INTRAVENOUS
  Administered 2015-08-27: 170 mg via INTRAVENOUS

## 2015-08-27 SURGICAL SUPPLY — 28 items
CANISTER SUCT 1200ML W/VALVE (MISCELLANEOUS) ×3 IMPLANT
FCP ESCP3.2XJMB 240X2.8X (MISCELLANEOUS) ×1
FORCEPS BIOP RAD 4 LRG CAP 4 (CUTTING FORCEPS) IMPLANT
FORCEPS BIOP RJ4 240 W/NDL (MISCELLANEOUS) ×2
FORCEPS ESCP3.2XJMB 240X2.8X (MISCELLANEOUS) ×1 IMPLANT
GOWN CVR UNV OPN BCK APRN NK (MISCELLANEOUS) ×2 IMPLANT
GOWN ISOL THUMB LOOP REG UNIV (MISCELLANEOUS) ×4
HEMOCLIP INSTINCT (CLIP) IMPLANT
INJECTOR VARIJECT VIN23 (MISCELLANEOUS) IMPLANT
KIT CO2 TUBING (TUBING) IMPLANT
KIT DEFENDO VALVE AND CONN (KITS) IMPLANT
KIT ENDO PROCEDURE OLY (KITS) ×3 IMPLANT
LIGATOR MULTIBAND 6SHOOTER MBL (MISCELLANEOUS) IMPLANT
MARKER SPOT ENDO TATTOO 5ML (MISCELLANEOUS) IMPLANT
PAD GROUND ADULT SPLIT (MISCELLANEOUS) IMPLANT
SNARE SHORT THROW 13M SML OVAL (MISCELLANEOUS) IMPLANT
SNARE SHORT THROW 30M LRG OVAL (MISCELLANEOUS) ×3 IMPLANT
SPOT EX ENDOSCOPIC TATTOO (MISCELLANEOUS)
SUCTION POLY TRAP 4CHAMBER (MISCELLANEOUS) IMPLANT
TRAP SUCTION POLY (MISCELLANEOUS) IMPLANT
TUBING CONN 6MMX3.1M (TUBING)
TUBING SUCTION CONN 0.25 STRL (TUBING) IMPLANT
UNDERPAD 30X60 958B10 (PK) (MISCELLANEOUS) IMPLANT
VALVE BIOPSY ENDO (VALVE) IMPLANT
VARIJECT INJECTOR VIN23 (MISCELLANEOUS)
WATER AUXILLARY (MISCELLANEOUS) IMPLANT
WATER STERILE IRR 250ML POUR (IV SOLUTION) ×3 IMPLANT
WATER STERILE IRR 500ML POUR (IV SOLUTION) IMPLANT

## 2015-08-27 NOTE — Anesthesia Procedure Notes (Signed)
Procedure Name: MAC Performed by: Benancio Osmundson Pre-anesthesia Checklist: Patient identified, Emergency Drugs available, Suction available, Timeout performed and Patient being monitored Patient Re-evaluated:Patient Re-evaluated prior to inductionOxygen Delivery Method: Nasal cannula Placement Confirmation: positive ETCO2     

## 2015-08-27 NOTE — Anesthesia Postprocedure Evaluation (Signed)
Anesthesia Post Note  Patient: Evan Stein  Procedure(s) Performed: Procedure(s) (LRB): COLONOSCOPY WITH PROPOFOL (N/A)  Patient location during evaluation: PACU Anesthesia Type: MAC Level of consciousness: awake and alert Pain management: pain level controlled Vital Signs Assessment: post-procedure vital signs reviewed and stable Respiratory status: spontaneous breathing, nonlabored ventilation, respiratory function stable and patient connected to nasal cannula oxygen Cardiovascular status: blood pressure returned to baseline and stable Postop Assessment: no signs of nausea or vomiting Anesthetic complications: no    Alisa Graff

## 2015-08-27 NOTE — Op Note (Addendum)
[facility] Patient Name: Evan Stein Procedure Date: 08/27/2015 8:48 AM MRN: [id number] Account #: [id number] Date of Birth: [birth year] Admit Type: Outpatient Age: 53 Room: [facility] OR ROOM 01 Gender: Male Note Status: Supervisor Override Procedure:         Colonoscopy Indications:       Change in bowel habits Providers:         Lucilla Lame, MD Medicines:         Propofol per Anesthesia Complications:     No immediate complications. Procedure:         Pre-Anesthesia Assessment:                    - Prior to the procedure, a History and Physical was                     performed, and patient medications and allergies were                     reviewed. The patient's tolerance of previous anesthesia                     was also reviewed. The risks and benefits of the procedure                     and the sedation options and risks were discussed with the                     patient. All questions were answered, and informed consent                     was obtained. Prior Anticoagulants: The patient has taken                     no previous anticoagulant or antiplatelet agents. ASA                     Grade Assessment: II - A patient with mild systemic                     disease. After reviewing the risks and benefits, the                     patient was deemed in satisfactory condition to undergo                     the procedure.                    After obtaining informed consent, the colonoscope was                     passed under direct vision. Throughout the procedure, the                     patient's blood pressure, pulse, and oxygen saturations                     were monitored continuously. The was introduced through                     the anus and advanced to the the cecum, identified by                     appendiceal orifice and ileocecal valve. The  colonoscopy                     was performed without difficulty. The patient  tolerated                     the procedure well. The quality of the bowel preparation                     was adequate. Findings:      The perianal and digital rectal examinations were normal.      A 2 mm polyp was found in the cecum. The polyp was sessile. The polyp       was removed with a cold biopsy forceps. Resection and retrieval were       complete.      A 8 mm polyp was found in the transverse colon. The polyp was sessile.       The polyp was removed with a cold snare. Resection and retrieval were       complete.      Two sessile polyps were found in the descending colon. The polyps were 2       to 4 mm in size. These polyps were removed with a cold biopsy forceps.       Resection and retrieval were complete. Impression:        - One 2 mm polyp in the cecum. Resected and retrieved.                    - One 8 mm polyp in the transverse colon. Resected and                     retrieved.                    - Two 2 to 4 mm polyps in the descending colon. Resected                     and retrieved. Recommendation:    - Await pathology results.                    - Repeat colonoscopy in 5 years if polyp adenoma and 10                     years if hyperplastic Procedure Code(s): --- Professional ---                    [phone], Colonoscopy, flexible; with removal of tumor(s),                     polyp(s), or other lesion(s) by snare technique                    45380, 43, Colonoscopy, flexible; with biopsy, single or                     multiple Diagnosis Code(s): --- Professional ---                    R19.4, Change in bowel habit                    D12.4, Benign neoplasm of descending colon                    D12.3, Benign neoplasm of transverse colon  D12.0, Benign neoplasm of cecum CPT copyright 2014 American Medical Association. All rights reserved. The codes documented in this report are preliminary and upon coder review may  be revised to meet current  compliance requirements. Lucilla Lame, MD 08/27/2015 9:12:30 AM This report has been signed electronically. Number of Addenda: 0 Note Initiated On: 08/27/2015 8:48 AM Scope Withdrawal Time: 0 hours 11 minutes 22 seconds  Total Procedure Duration: 0 hours 12 minutes 57 seconds       [facility]

## 2015-08-27 NOTE — Transfer of Care (Signed)
Immediate Anesthesia Transfer of Care Note  Patient: Evan Stein  Procedure(s) Performed: Procedure(s): COLONOSCOPY WITH PROPOFOL (N/A)  Patient Location: PACU  Anesthesia Type: MAC  Level of Consciousness: awake, alert  and patient cooperative  Airway and Oxygen Therapy: Patient Spontanous Breathing and Patient connected to supplemental oxygen  Post-op Assessment: Post-op Vital signs reviewed, Patient's Cardiovascular Status Stable, Respiratory Function Stable, Patent Airway and No signs of Nausea or vomiting  Post-op Vital Signs: Reviewed and stable  Complications: No apparent anesthesia complications

## 2015-08-27 NOTE — Anesthesia Preprocedure Evaluation (Signed)
Anesthesia Evaluation  Patient identified by MRN, date of birth, ID band Patient awake    Reviewed: Allergy & Precautions, H&P , NPO status , Patient's Chart, lab work & pertinent test results, reviewed documented beta blocker date and time   Airway Mallampati: II  TM Distance: >3 FB Neck ROM: full    Dental no notable dental hx.    Pulmonary neg pulmonary ROS,    Pulmonary exam normal breath sounds clear to auscultation       Cardiovascular Exercise Tolerance: Good hypertension,  Rhythm:regular Rate:Normal     Neuro/Psych negative neurological ROS  negative psych ROS   GI/Hepatic negative GI ROS, Neg liver ROS,   Endo/Other  negative endocrine ROS  Renal/GU negative Renal ROS  negative genitourinary   Musculoskeletal   Abdominal   Peds  Hematology negative hematology ROS (+)   Anesthesia Other Findings   Reproductive/Obstetrics negative OB ROS                             Anesthesia Physical Anesthesia Plan  ASA: II  Anesthesia Plan: MAC   Post-op Pain Management:    Induction:   Airway Management Planned:   Additional Equipment:   Intra-op Plan:   Post-operative Plan:   Informed Consent: I have reviewed the patients History and Physical, chart, labs and discussed the procedure including the risks, benefits and alternatives for the proposed anesthesia with the patient or authorized representative who has indicated his/her understanding and acceptance.   Dental Advisory Given  Plan Discussed with: CRNA  Anesthesia Plan Comments:         Anesthesia Quick Evaluation  

## 2015-08-27 NOTE — H&P (Signed)
[facility]  [address], [place], [place] [address] Phone: [phone] Fax : [phone]  Primary Care Physician:  Tommie Sams, MD Primary Gastroenterologist:  Dr. Allen Norris  Pre-Procedure History & Physical: HPI:  Evan Stein is a 54 y.o. male is here for an colonoscopy.   Past Medical History  Diagnosis Date  . Umbilical hernia   . Hypertension     WITH MEDICAL PROCEDURES    Past Surgical History  Procedure Laterality Date  . Wisdom tooth extraction    . Umbilical hernia repair N/A 07/27/2015    Procedure: HERNIA REPAIR UMBILICAL ADULT;  Surgeon: Florene Glen, MD;  Location: [facility] ORS;  Service: General;  Laterality: N/A;  . Cardiac catheterization  2011    DR Nehemiah Massed, PERFECT    Prior to Admission medications   Medication Sig Start Date End Date Taking? Authorizing Provider  Ascorbic Acid (VITAMIN C) 1000 MG tablet Take 1,000 mg by mouth daily. LUNCH, 5 DAYS/WEEK    Historical Provider, MD  aspirin 81 MG tablet Take 81 mg by mouth daily. [place]    Historical Provider, MD  cholecalciferol (VITAMIN D) 1000 UNITS tablet Take 1,000 Units by mouth daily. [place]    Historical Provider, MD  Ginseng 250 MG CAPS Take 1 capsule by mouth 1 day or 1 dose. [place]    Historical Provider, MD  MISC NATURAL PRODUCT OP Take 1 capsule by mouth. Tribulus    Historical Provider, MD  Multiple Vitamin (MULTIVITAMIN) tablet Take 1 tablet by mouth daily.    Historical Provider, MD  Omega-3 Fatty Acids (FISH OIL) 1000 MG CAPS Take 1 capsule by mouth once.    Historical Provider, MD  ondansetron (ZOFRAN) 4 MG tablet Take 1 tablet (4 mg total) by mouth daily as needed for nausea or vomiting. Patient not taking: Reported on 08/06/2015 07/10/15   Lavonia Drafts, MD  oxyCODONE-acetaminophen (PERCOCET/ROXICET) 5-325 MG tablet Reported on 08/27/2015 07/27/15   Historical Provider, MD  vitamin E 1000 UNIT capsule Take 1,000 Units by mouth daily. 5 DAYS/WEEK     Historical Provider, MD    Allergies as of 08/07/2015  . (No Known Allergies)    Family History  Problem Relation Age of Onset  . Hypertension Father     Social History   Social History  . Marital Status: Single    Spouse Name: N/A  . Number of Children: N/A  . Years of Education: N/A   Occupational History  . Not on file.   Social History Main Topics  . Smoking status: Never Smoker   . Smokeless tobacco: Former Systems developer  . Alcohol Use: 0.0 oz/week    0 Standard drinks or equivalent per week     Comment: rare  . Drug Use: No  . Sexual Activity: Not on file   Other Topics Concern  . Not on file   Social History Narrative    Review of Systems: See HPI, otherwise negative ROS  Physical Exam: BP 131/89 mmHg  Pulse 82  Temp(Src) 98.1 F (36.7 C)  Ht 6\' 2"  (1.88 m)  Wt 252 lb (114.306 kg)  BMI 32.34 kg/m2  SpO2 96% General:   Alert,  pleasant and cooperative in NAD Head:  Normocephalic and atraumatic. Neck:  Supple; no masses or thyromegaly. Lungs:  Clear throughout to auscultation.    Heart:  Regular rate and rhythm. Abdomen:  Soft, nontender and nondistended. Normal bowel sounds, without guarding, and without rebound.   Neurologic:  Alert and  oriented x4;  grossly normal neurologically.  Impression/Plan: Evan Stein is here for an colonoscopy to be performed for change in bowel habits.  Risks, benefits, limitations, and alternatives regarding  colonoscopy have been reviewed with the patient.  Questions have been answered.  All parties agreeable.   Evan Bowl, MD  08/27/2015, 8:24 AM

## 2015-08-28 ENCOUNTER — Encounter: Payer: Self-pay | Admitting: Gastroenterology

## 2015-09-01 ENCOUNTER — Encounter: Payer: Self-pay | Admitting: Gastroenterology

## 2015-09-09 ENCOUNTER — Encounter: Payer: Self-pay | Admitting: Surgery

## 2015-09-09 ENCOUNTER — Ambulatory Visit (INDEPENDENT_AMBULATORY_CARE_PROVIDER_SITE_OTHER): Payer: BLUE CROSS/BLUE SHIELD | Admitting: Surgery

## 2015-09-09 VITALS — BP 145/99 | HR 73 | Temp 97.8°F | Ht 74.0 in | Wt 260.0 lb

## 2015-09-09 DIAGNOSIS — D120 Benign neoplasm of cecum: Secondary | ICD-10-CM

## 2015-09-09 DIAGNOSIS — D124 Benign neoplasm of descending colon: Secondary | ICD-10-CM

## 2015-09-09 DIAGNOSIS — R194 Change in bowel habit: Secondary | ICD-10-CM

## 2015-09-09 DIAGNOSIS — D123 Benign neoplasm of transverse colon: Secondary | ICD-10-CM

## 2015-09-09 DIAGNOSIS — K420 Umbilical hernia with obstruction, without gangrene: Secondary | ICD-10-CM

## 2015-09-09 DIAGNOSIS — Z1211 Encounter for screening for malignant neoplasm of colon: Secondary | ICD-10-CM

## 2015-09-09 DIAGNOSIS — K219 Gastro-esophageal reflux disease without esophagitis: Secondary | ICD-10-CM | POA: Insufficient documentation

## 2015-09-09 MED ORDER — OMEPRAZOLE 40 MG PO CPDR: 40.0000 mg | DELAYED_RELEASE_CAPSULE | Freq: Two times a day (BID) | ORAL | Status: DC

## 2015-09-09 NOTE — Patient Instructions (Signed)
Take your Omeprazole twice a day. Try taking it 30 minutes before you eat a meal.

## 2015-09-09 NOTE — Progress Notes (Signed)
55 yr old male 6 weeks out from umbilical hernia repair and 2 weeks from colonoscopy.  Patient doing well but states still some issues with constipation.  He has increased his water intake and taking fiber and is having one bowel movement per day but sometimes still feels constipation.  He also is complaining of severe acid reflux, indgestion and burping after every meal, getting worse over the past 3 months.  He denies any regurgitation of fluid, damage to teeth or asthma like symptoms.   Filed Vitals:   09/09/15 1025  BP: 145/99  Pulse: 73  Temp: 97.8 F (36.6 C)   PE:  Gen: NAD Res: CTAB/L Abd: slight epigastric tenderness, umbilical incision, suture spiting from right lateral corner removed, otherwise healing well.  Ext: 2+ pulses, no edema  A/P:  I discussed his colonoscopy results showing four lesions that were all benign and no dysplasia, informed him that a repeat colonoscopy would be needed in 5 years.   Constipation: continue increased water intake and fiber, may increase fiber as needed for constipation   GERD: newly diagnosed, discussed diet modifications avoiding caffeine, chocolate, spicy foods and EtOH, will give omeprazole BID and have him return in 3 months to ensure improvement.  If no improvement will have him see Dr. Allen Norris for possible EGD at that time.

## 2015-12-07 ENCOUNTER — Telehealth: Payer: Self-pay | Admitting: Gastroenterology

## 2015-12-07 NOTE — Telephone Encounter (Signed)
Called patient to set up appointment for GERD and constipation. Patient said he is doing so much better and doesn't feel he needs to come in at this time but will call us if needed. He also said to thank everyone for all they did for him.

## 2015-12-07 NOTE — Telephone Encounter (Signed)
-----   Message from [place], [place] sent at 12/07/2015 11:06 AM EDT ----- Regarding: FW: F/U appt   ----- Message -----    From: Wayna Chalet, CMA    Sent: 12/07/2015      To: Wayna Chalet, CMA Subject: F/U appt                                       Tell the front desk to contact patient so they could schedule an appointment with Dr. Azalee Course to follow up on his Acid Reflux and constipation.

## 2016-05-23 ENCOUNTER — Ambulatory Visit: Payer: BLUE CROSS/BLUE SHIELD | Attending: Neurology | Admitting: Neurology

## 2016-05-23 DIAGNOSIS — G4761 Periodic limb movement disorder: Secondary | ICD-10-CM | POA: Insufficient documentation

## 2016-05-23 DIAGNOSIS — R0683 Snoring: Secondary | ICD-10-CM

## 2016-05-23 DIAGNOSIS — G4733 Obstructive sleep apnea (adult) (pediatric): Secondary | ICD-10-CM | POA: Diagnosis present

## 2016-05-26 NOTE — Procedures (Signed)
Wren A. Merlene Laughter, MD     [email or web address]             NOCTURNAL POLYSOMNOGRAPHY   LOCATION: [facility]  Patient Name: Evan Stein, Evan Stein Date: 05/23/2016 Gender: Male D.O.B: 1961/05/30 Age (years): 25 Referring Provider: Phillips Odor MD, ABSM Height (inches): 74 Interpreting Physician: Phillips Odor MD, ABSM Weight (lbs): 260 RPSGT: Rosebud Poles BMI: 33 MRN: [id number] Neck Size: 18.00 CLINICAL INFORMATION Sleep Study Type: NPSG Indication for sleep study: Snoring Epworth Sleepiness Score: 1 SLEEP STUDY TECHNIQUE As per the AASM Manual for the Scoring of Sleep and Associated Events v2.3 (April 2016) with a hypopnea requiring 4% desaturations. The channels recorded and monitored were frontal, central and occipital EEG, electrooculogram (EOG), submentalis EMG (chin), nasal and oral airflow, thoracic and abdominal wall motion, anterior tibialis EMG, snore microphone, electrocardiogram, and pulse oximetry. MEDICATIONS Patient's medications include: N/A. Medications self-administered by patient during sleep study : No sleep medicine administered.  Current Outpatient Prescriptions:  .  Ascorbic Acid (VITAMIN C) 1000 MG tablet, Take 1,000 mg by mouth daily. LUNCH, 5 DAYS/WEEK, Disp: , Rfl:  .  aspirin 81 MG tablet, Take 81 mg by mouth daily. 5 DAYS WEEK, Disp: , Rfl:  .  cholecalciferol (VITAMIN D) 1000 UNITS tablet, Take 1,000 Units by mouth daily. 5 DAYS WEEK, Disp: , Rfl:  .  Ginseng 250 MG CAPS, Take 1 capsule by mouth 1 day or 1 dose. 5 DAYS Basil Dess, Disp: , Rfl:  .  MISC NATURAL PRODUCT OP, Take 1 capsule by mouth. Tribulus, Disp: , Rfl:  .  Multiple Vitamin (MULTIVITAMIN) tablet, Take 1 tablet by mouth daily., Disp: , Rfl:  .  Omega-3 Fatty Acids (FISH OIL) 1000 MG CAPS, Take 1 capsule by mouth once., Disp: , Rfl:  .  omeprazole (PRILOSEC) 40 MG capsule, Take 1 capsule (40 mg total) by mouth 2 (two) times daily., Disp: 60 capsule, Rfl:  4 .  vitamin E 1000 UNIT capsule, Take 1,000 Units by mouth daily. 5 DAYS/WEEK, Disp: , Rfl:   SLEEP ARCHITECTURE The study was initiated at 9:30:45 PM and ended at 4:52:13 AM. Sleep onset time was 15.3 minutes and the sleep efficiency was 72.0%. The total sleep time was 318.0 minutes. Stage REM latency was 63.5 minutes. The patient spent 6.45% of the night in stage N1 sleep, 62.26% in stage N2 sleep, 11.95% in stage N3 and 19.34% in REM. Alpha intrusion was absent. Supine sleep was 2.72%. RESPIRATORY PARAMETERS The overall apnea/hypopnea index (AHI) was 6.2 per hour. There were 9 total apneas, including 3 obstructive, 4 central and 2 mixed apneas. There were 24 hypopneas and 0 RERAs. The AHI during Stage REM sleep was 13.7 per hour. AHI while supine was 41.7 per hour. The mean oxygen saturation was 91.94%. The minimum SpO2 during sleep was 85.00%. Loud snoring was noted during this study. CARDIAC DATA The 2 lead EKG demonstrated sinus rhythm. The mean heart rate was N/A beats per minute. Other EKG findings include: None. LEG MOVEMENT DATA The total PLMS were 343 with a resulting PLMS index of 64.72. Associated arousal with leg movement index was 5.8.    IMPRESSIONS  - Severe periodic limb movements of sleep occurred during the study. Associated arousals were significant. This can be correlated clinically with restless leg syndrome. Consider dopamine agonist  such as Mirapex or Requip if clinically appropriate.  - Mild obstructive sleep apnea not requiring positive pressure treatment.      Delano Metz, MD Diplomate, American  Board of Sleep Medicine.

## 2017-08-28 IMAGING — CT CT ABD-PELV W/ CM
1 of 3 series · 14 of 32 positions shown, 19 images · IV contrast (omnipaque)
Comparison: None.

CLINICAL DATA: Rule out umbilical hernia.  Nagging pain.

EXAM:
CT ABDOMEN AND PELVIS WITH CONTRAST
TECHNIQUE: Multidetector CT imaging of the abdomen and pelvis was performed
using the standard protocol following bolus administration of
intravenous contrast.
CONTRAST:  100mL OMNIPAQUE IOHEXOL 300 MG/ML  SOLN

[Series 2: routine abd pel with · axial · 0.86mm/px · z∈[-248,+202]mm · 14 of 102 slices shown, 19 images]
[im 6/102  soft-tissue]
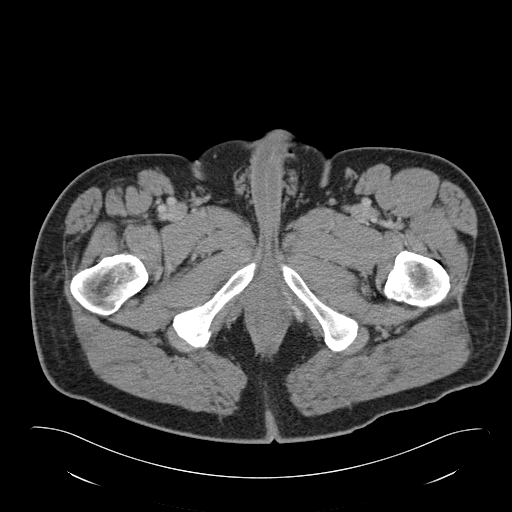
[im 6/102  bone]
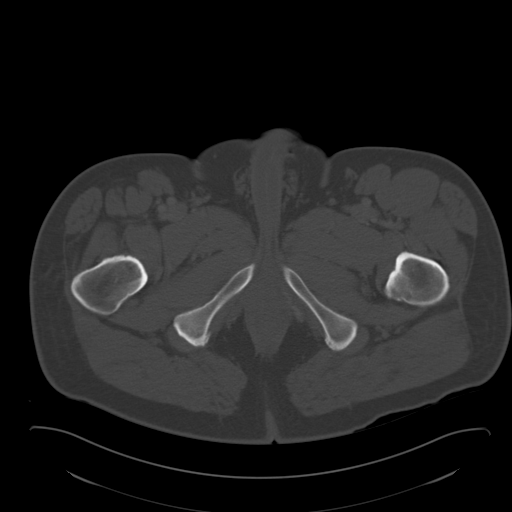
[im 16/102  soft-tissue]
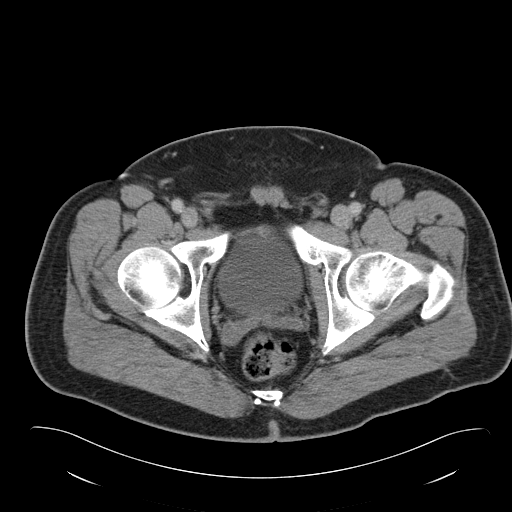
[im 22/102  soft-tissue]
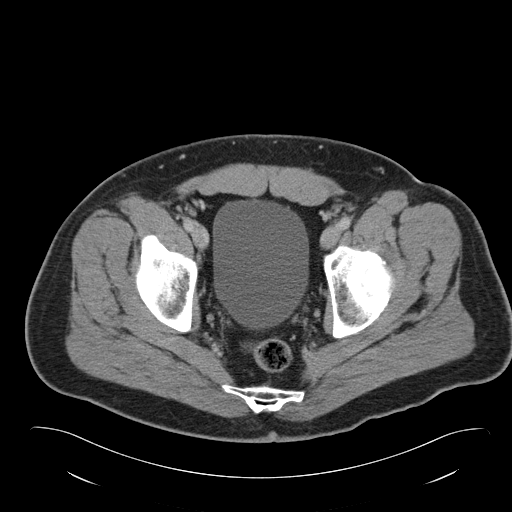
[im 27/102  soft-tissue]
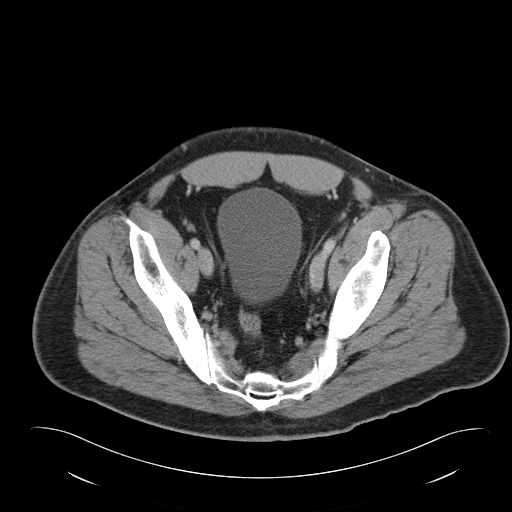
[im 38/102  soft-tissue]
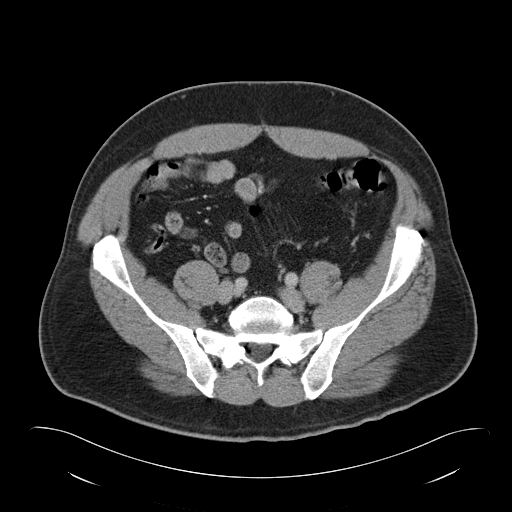
[im 43/102  soft-tissue]
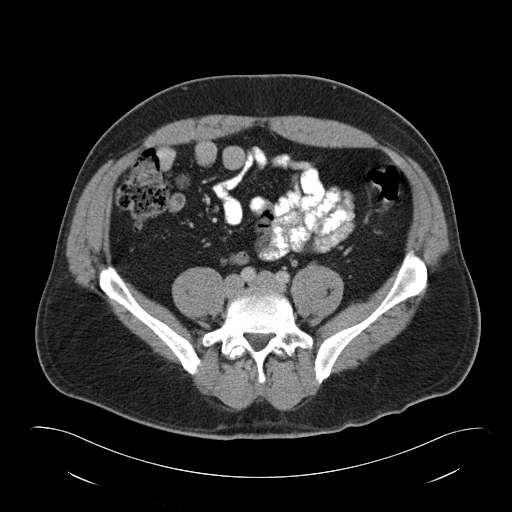
[im 54/102  soft-tissue]
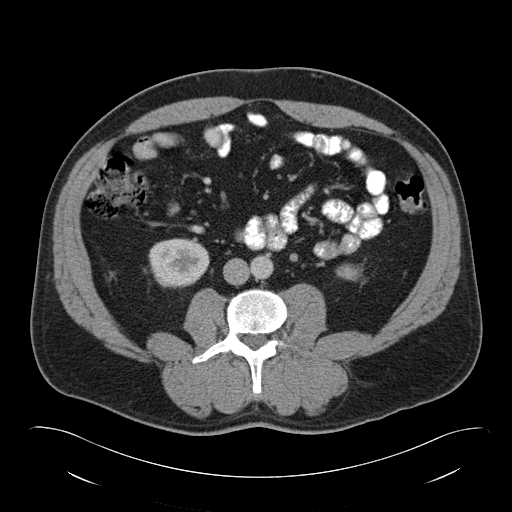
[im 59/102  soft-tissue]
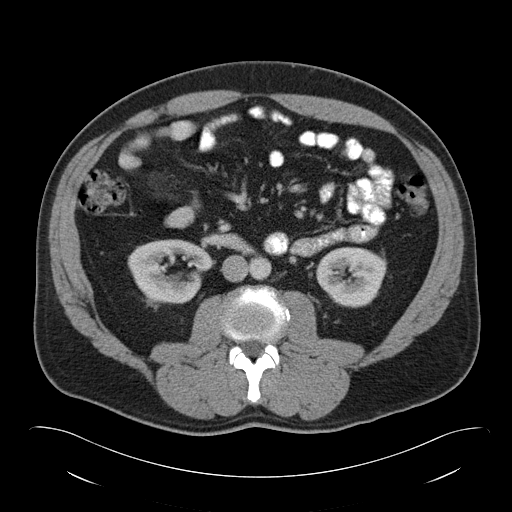
[im 64/102  soft-tissue]
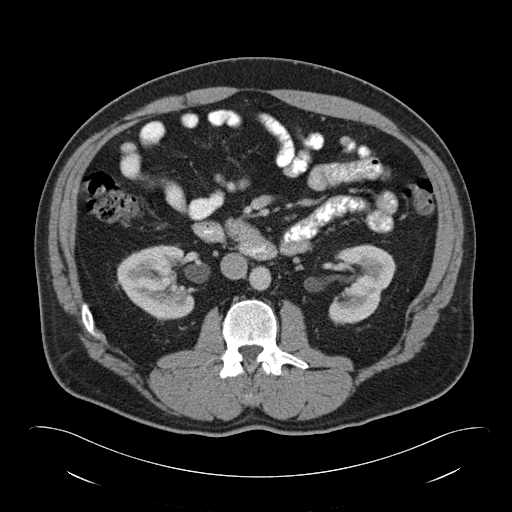
[im 64/102  bone]
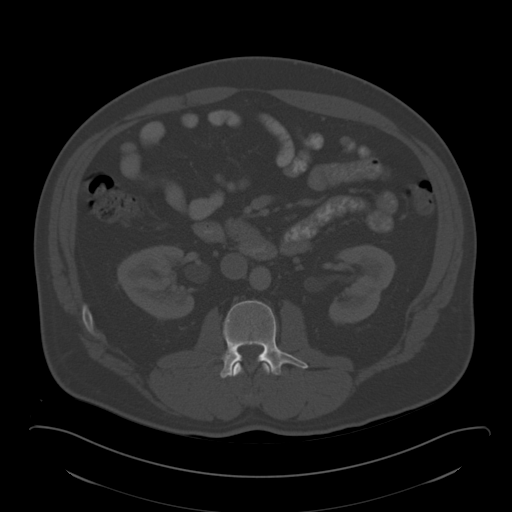
[im 75/102  soft-tissue]
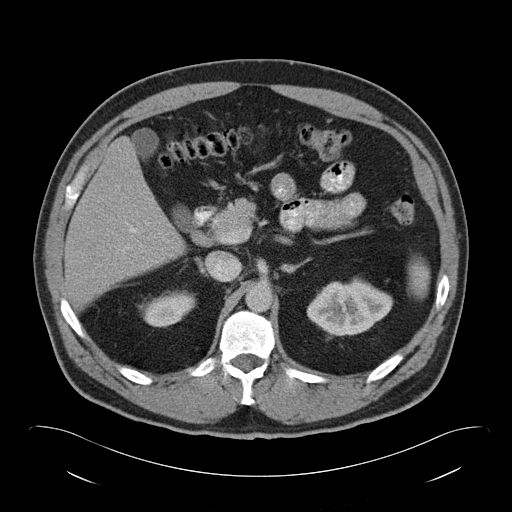
[im 80/102  soft-tissue]
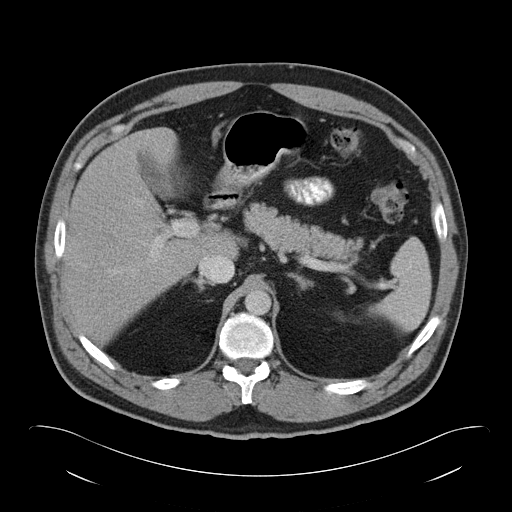
[im 80/102  lung]
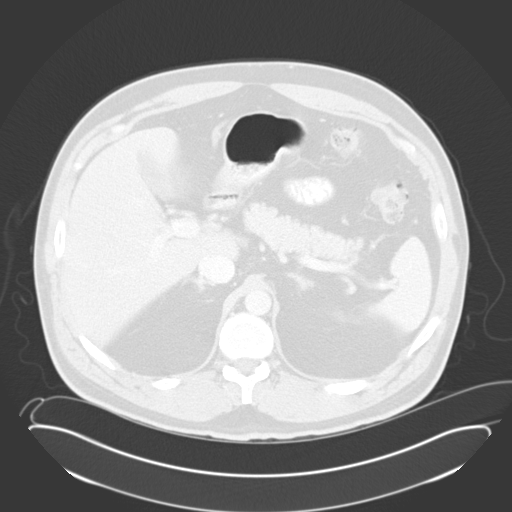
[im 86/102  soft-tissue]
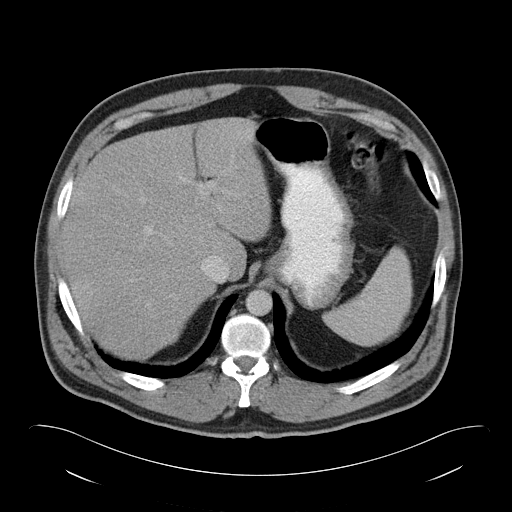
[im 86/102  lung]
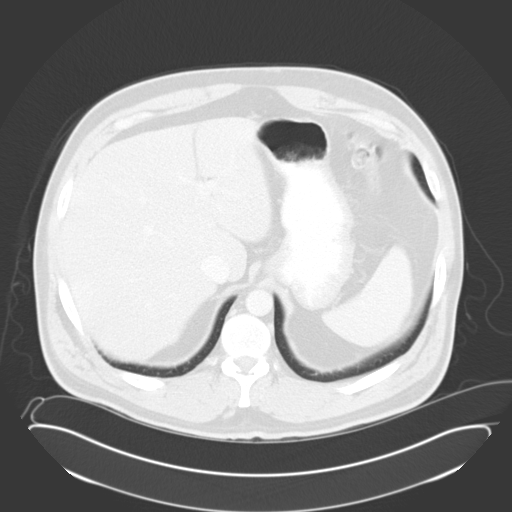
[im 91/102  lung]
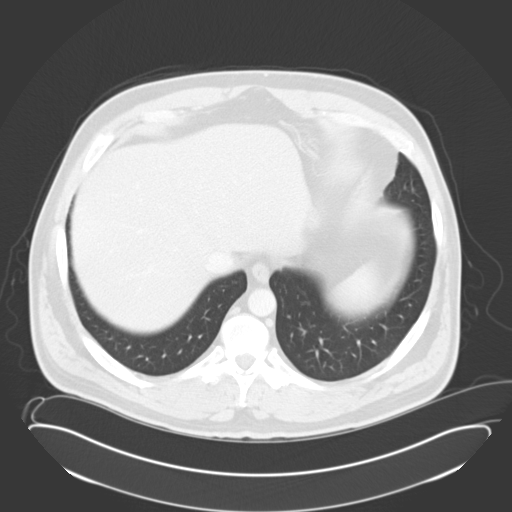
[im 96/102  soft-tissue]
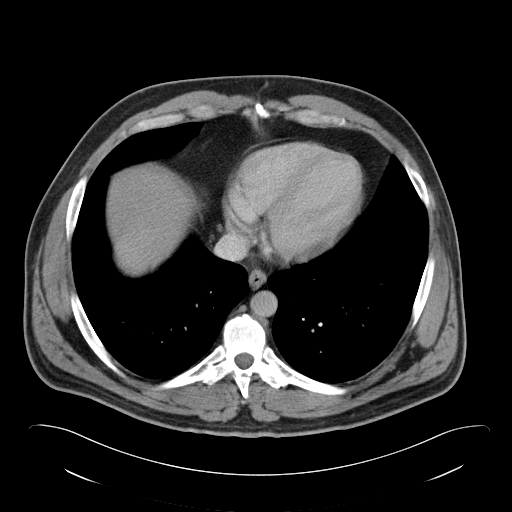
[im 96/102  lung]
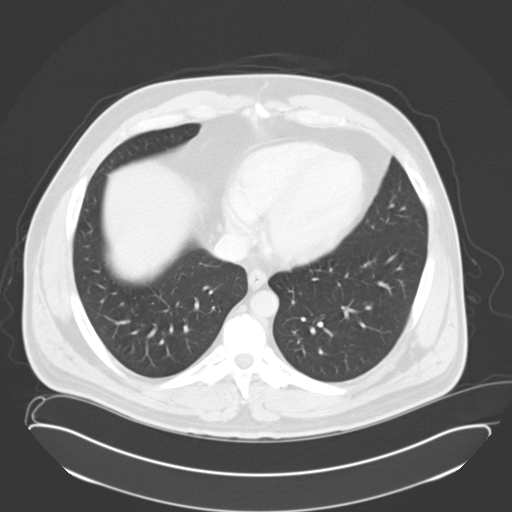

[14 of 32 positions shown; findings below may reference images not displayed]

FINDINGS: Lower chest: There is no pleural effusion identified. The lung bases
appear clear.

Hepatobiliary: No suspicious liver abnormality. The gallbladder
appears normal. No biliary dilatation.

Pancreas: Normal appearance of the pancreas.

Spleen: The spleen is unremarkable.

Adrenals/Urinary Tract: The adrenal glands are negative. Normal
appearance of both kidneys. No nephrolithiasis or obstructive
uropathy. The urinary bladder appears normal.

Stomach/Bowel: The stomach is within normal limits. The small bowel
loops have a normal course and caliber. No obstruction. Normal
appearance of the colon. The appendix is visualized and appears
normal. Distal colonic diverticula noted. No acute inflammation.

Vascular/Lymphatic: Normal appearance of the abdominal aorta. No
enlarged retroperitoneal or mesenteric adenopathy. No enlarged
pelvic or inguinal lymph nodes.

Reproductive: Prostate gland and seminal vesicles are normal in
appearance.

Other: There is an umbilical hernia which contains fat only. This
measures 3.2 x 2.9 x 2.7 cm.

Musculoskeletal: Mild degenerative disc disease within the lumbar
spine. No aggressive lytic or sclerotic bone lesions.
IMPRESSION: 1. No acute findings.
2. Fat containing umbilical hernia measures 3.2 x 2.4 x 2.7 cm.

## 2021-01-02 ENCOUNTER — Other Ambulatory Visit: Payer: Self-pay

## 2021-01-02 ENCOUNTER — Ambulatory Visit
Admission: EM | Admit: 2021-01-02 | Discharge: 2021-01-02 | Disposition: A | Discharge status: Home / Self Care | Payer: BLUE CROSS/BLUE SHIELD | Attending: Sports Medicine | Admitting: Sports Medicine

## 2021-01-02 DIAGNOSIS — Z20822 Contact with and (suspected) exposure to covid-19: Secondary | ICD-10-CM | POA: Diagnosis not present

## 2021-01-02 DIAGNOSIS — R0602 Shortness of breath: Secondary | ICD-10-CM | POA: Diagnosis not present

## 2021-01-02 DIAGNOSIS — B349 Viral infection, unspecified: Secondary | ICD-10-CM | POA: Diagnosis not present

## 2021-01-02 DIAGNOSIS — R059 Cough, unspecified: Secondary | ICD-10-CM | POA: Insufficient documentation

## 2021-01-02 MED ORDER — PROMETHAZINE-DM 6.25-15 MG/5ML PO SYRP: 5.0000 mL | ORAL_SOLUTION | Freq: Four times a day (QID) | ORAL | 0 refills | Status: DC | PRN

## 2021-01-02 NOTE — ED Triage Notes (Addendum)
Patient complains of cough with productivity x 1 week. States that he has noticed that he has been wheezing at night. Patient has been exposed to Covid by his mother.

## 2021-01-02 NOTE — Discharge Instructions (Addendum)

## 2021-01-02 NOTE — ED Provider Notes (Signed)
[facility]    CSN: [id number] Arrival date & time: 01/02/21  1046      History   Chief Complaint Chief Complaint  Patient presents with  . Cough    HPI Evan Stein is a 60 y.o. male presenting for ~1 week history of occasionally productive cough and wheezing at night. Patient does report exposure to COVID 19 by his mother. He has not been vaccinated for COVID.  Admits personal history of COVID-19 in December 2020.  Patient does also report that his sister has COVID as well.  He has not had any fevers but has been a little fatigued.  No nasal congestion, sore throat, headaches, body aches, chest pain.  Admits to mild shortness of breath.  He says he has a remote history of asthma and the last time he had to use nasal inhaler was about 15 years ago.  He does not currently have one.  Has been taking over-the-counter Mucinex since yesterday.  Unsure of its been helping.  He has been taking zinc, vitamin C and vitamin D.  He denies any history of heart problems or other lung disease.  No other complaints or concerns today.  HPI  Past Medical History:  Diagnosis Date  . Hypertension    WITH MEDICAL PROCEDURES  . Umbilical hernia     Patient Active Problem List   Diagnosis Date Noted  . GERD (gastroesophageal reflux disease) 09/09/2015  . Special screening for malignant neoplasms, colon   . Benign neoplasm of cecum   . Benign neoplasm of transverse colon   . Benign neoplasm of descending colon   . Change in bowel habits   . Umbilical hernia 77/41/2878    Past Surgical History:  Procedure Laterality Date  . CARDIAC CATHETERIZATION  2011   DR Nehemiah Massed, PERFECT  . COLONOSCOPY WITH PROPOFOL N/A 08/27/2015   Procedure: COLONOSCOPY WITH PROPOFOL;  Surgeon: Lucilla Lame, MD;  Location[facility];  Service: Endoscopy;  Laterality: N/A;  . UMBILICAL HERNIA REPAIR N/A 07/27/2015   Procedure: HERNIA REPAIR UMBILICAL ADULT;  Surgeon: Florene Glen, MD;   Location: [facility];  Service: General;  Laterality: N/A;  . WISDOM TOOTH EXTRACTION         Home Medications    Prior to Admission medications   Medication Sig Start Date End Date Taking? Authorizing Provider  Ascorbic Acid (VITAMIN C) 1000 MG tablet Take 1,000 mg by mouth daily. LUNCH, 5 DAYS/WEEK   Yes [provider]  cholecalciferol (VITAMIN D) 1000 UNITS tablet Take 1,000 Units by mouth daily. 5 DAYS WEEK   Yes [provider]  Ginseng 250 MG CAPS Take 1 capsule by mouth 1 day or 1 dose. 5 DAYS Basil Dess   Yes [provider]  MISC NATURAL PRODUCT OP Take 1 capsule by mouth. Tribulus   Yes [provider]  Multiple Vitamin (MULTIVITAMIN) tablet Take 1 tablet by mouth daily.   Yes [provider]  Omega-3 Fatty Acids (FISH OIL) 1000 MG CAPS Take 1 capsule by mouth once.   Yes [provider]  promethazine-dextromethorphan (PROMETHAZINE-DM) 6.25-15 MG/5ML syrup Take 5 mLs by mouth 4 (four) times daily as needed for cough. 01/02/21  Yes Laurene Footman B, PA-C  vitamin E 1000 UNIT capsule Take 1,000 Units by mouth daily. 5 DAYS/WEEK   Yes [provider]  aspirin 81 MG tablet Take 81 mg by mouth daily. 5 DAYS WEEK    [provider]  omeprazole (PRILOSEC) 40 MG capsule  Take 1 capsule (40 mg total) by mouth 2 (two) times daily. 09/09/15   Hubbard Robinson, MD    Family History Family History  Problem Relation Age of Onset  . Hypertension Father     Social History Social History   Tobacco Use  . Smoking status: Never Smoker  . Smokeless tobacco: Former Network engineer Use Topics  . Alcohol use: Yes    Alcohol/week: 0.0 standard drinks    Comment: rare  . Drug use: No     Allergies   Patient has no known allergies.   Review of Systems Review of Systems  Constitutional: Positive for fatigue. Negative for fever.  HENT: Positive for congestion and rhinorrhea. Negative for sinus pressure, sinus pain and  sore throat.   Respiratory: Positive for cough, shortness of breath and wheezing.   Cardiovascular: Negative for chest pain.  Gastrointestinal: Negative for abdominal pain, diarrhea, nausea and vomiting.  Musculoskeletal: Negative for myalgias.  Neurological: Negative for weakness, light-headedness and headaches.  Hematological: Negative for adenopathy.     Physical Exam Triage Vital Signs ED Triage Vitals [01/02/21 1059]  Enc Vitals Group     BP      Pulse      Resp      Temp      Temp src      SpO2      Weight 237 lb (107.5 kg)     Height 6\' 2"  (1.88 m)     Head Circumference      Peak Flow      Pain Score 0     Pain Loc      Pain Edu?      Excl. in [place]?    No data found.  Updated Vital Signs BP (!) 136/94 (BP Location: Left Arm)   Pulse 66   Temp 97.8 F (36.6 C) (Oral)   Resp 18   Ht 6\' 2"  (1.88 m)   Wt 237 lb (107.5 kg)   SpO2 97%   BMI 30.43 kg/m       Physical Exam Vitals and nursing note reviewed.  Constitutional:      General: He is not in acute distress.    Appearance: Normal appearance. He is well-developed. He is not ill-appearing or diaphoretic.  HENT:     Head: Normocephalic and atraumatic.     Right Ear: Tympanic membrane, ear canal and external ear normal.     Left Ear: Tympanic membrane, ear canal and external ear normal.     Nose: Congestion and rhinorrhea present.     Mouth/Throat:     Mouth: Mucous membranes are moist.     Pharynx: Oropharynx is clear. Uvula midline. No oropharyngeal exudate.     Tonsils: No tonsillar abscesses.  Eyes:     General: No scleral icterus.       Right eye: No discharge.        Left eye: No discharge.     Conjunctiva/sclera: Conjunctivae normal.  Neck:     Thyroid: No thyromegaly.     Trachea: No tracheal deviation.  Cardiovascular:     Rate and Rhythm: Normal rate and regular rhythm.     Heart sounds: Normal heart sounds.  Pulmonary:     Effort: Pulmonary effort is normal. No respiratory distress.      Breath sounds: Normal breath sounds. No wheezing, rhonchi or rales.  Musculoskeletal:     Cervical back: Normal range of motion and neck supple.  Lymphadenopathy:  Cervical: No cervical adenopathy.  Skin:    General: Skin is warm and dry.     Findings: No rash.  Neurological:     General: No focal deficit present.     Mental Status: He is alert. Mental status is at baseline.     Motor: No weakness.  Psychiatric:        Mood and Affect: Mood normal.        Behavior: Behavior normal.        Thought Content: Thought content normal.      UC Treatments / Results  Labs (all labs ordered are listed, but only abnormal results are displayed) Labs Reviewed  SARS CORONAVIRUS 2 (TAT 6-24 HRS)    EKG   Radiology No results found.  Procedures Procedures (including critical care time)  Medications Ordered in UC Medications - No data to display  Initial Impression / Assessment and Plan / UC Course  I have reviewed the triage vital signs and the nursing notes.  Pertinent labs & imaging results that were available during my care of the patient were reviewed by me and considered in my medical decision making (see chart for details).   60 year old COVID-19 unvaccinated male presenting for cough and congestion as well as wheezing and shortness of breath for 1 week.  Symptoms not significantly worsening.  All vital signs normal and stable.  He is afebrile.  He has minor congestion on exam.  No wheezing, rhonchi or rales.  Breath sounds are completely normal.  Heart regular rate and rhythm.  COVID test obtained.  Current CDC guidelines, isolation protocol and ED precautions reviewed patient.  He is out of the window for antiviral therapy but could be a candidate for antibody therapy.  Advised if he test positive someone from the [place] clinic may reach out to him.  At this time, encouraged increasing rest and fluids.  I did offer prednisone and an inhaler but he declined those.  I sent  Promethazine DM for his cough.  I did review when to return including if he develops a fever, worsening cough, chest pain or breathing difficulty.   Final Clinical Impressions(s) / UC Diagnoses   Final diagnoses:  Viral illness  Cough  Shortness of breath  Exposure to COVID-19 virus     Discharge Instructions     You have received COVID testing today either for positive exposure, concerning symptoms that could be related to COVID infection, screening purposes, or re-testing after confirmed positive.  Your test obtained today checks for active viral infection in the last 1-2 weeks. If your test is negative now, you can still test positive later. So, if you do develop symptoms you should either get re-tested and/or isolate x 5 days and then strict mask use x 5 days (unvaccinated) or mask use x 10 days (vaccinated). Please follow CDC guidelines.  While Rapid antigen tests come back in 15-20 minutes, send out PCR/molecular test results typically come back within 1-3 days. In the mean time, if you are symptomatic, assume this could be a positive test and treat/monitor yourself as if you do have COVID.   We will call with test results if positive. Please download the MyChart app and set up a profile to access test results.   If symptomatic, go home and rest. Push fluids. Take Tylenol as needed for discomfort. Gargle warm salt water. Throat lozenges. Take Mucinex DM or Robitussin for cough. Humidifier in bedroom to ease coughing. Warm showers. Also review the COVID handout for  more information.  COVID-19 INFECTION: The incubation period of COVID-19 is approximately 14 days after exposure, with most symptoms developing in roughly 4-5 days. Symptoms may range in severity from mild to critically severe. Roughly 80% of those infected will have mild symptoms. People of any age may become infected with COVID-19 and have the ability to transmit the virus. The most common symptoms include: fever, fatigue,  cough, body aches, headaches, sore throat, nasal congestion, shortness of breath, nausea, vomiting, diarrhea, changes in smell and/or taste.    COURSE OF ILLNESS Some patients may begin with mild disease which can progress quickly into critical symptoms. If your symptoms are worsening please call ahead to the Emergency Department and proceed there for further treatment. Recovery time appears to be roughly 1-2 weeks for mild symptoms and 3-6 weeks for severe disease.   GO IMMEDIATELY TO ER FOR FEVER YOU ARE UNABLE TO GET DOWN WITH TYLENOL, BREATHING PROBLEMS, CHEST PAIN, FATIGUE, LETHARGY, INABILITY TO EAT OR DRINK, ETC  QUARANTINE AND ISOLATION: To help decrease the spread of COVID-19 please remain isolated if you have COVID infection or are highly suspected to have COVID infection. This means -stay home and isolate to one room in the home if you live with others. Do not share a bed or bathroom with others while ill, sanitize and wipe down all countertops and keep common areas clean and disinfected. Stay home for 5 days. If you have no symptoms or your symptoms are resolving after 5 days, you can leave your house. Continue to wear a mask around others for 5 additional days. If you have been in close contact (within 6 feet) of someone diagnosed with COVID 19, you are advised to quarantine in your home for 14 days as symptoms can develop anywhere from 2-14 days after exposure to the virus. If you develop symptoms, you  must isolate.  Most current guidelines for COVID after exposure -unvaccinated: isolate 5 days and strict mask use x 5 days. Test on day 5 is possible -vaccinated: wear mask x 10 days if symptoms do not develop -You do not necessarily need to be tested for COVID if you have + exposure and  develop symptoms. Just isolate at home x10 days from symptom onset During this global pandemic, CDC advises to practice social distancing, try to stay at least 36ft away from others at all times. Wear a  face covering. Wash and sanitize your hands regularly and avoid going anywhere that is not necessary.  KEEP IN MIND THAT THE COVID TEST IS NOT 100% ACCURATE AND YOU SHOULD STILL DO EVERYTHING TO PREVENT POTENTIAL SPREAD OF VIRUS TO OTHERS (WEAR MASK, WEAR GLOVES, [place] HANDS AND SANITIZE REGULARLY). IF INITIAL TEST IS NEGATIVE, THIS MAY NOT MEAN YOU ARE DEFINITELY NEGATIVE. MOST ACCURATE TESTING IS DONE 5-7 DAYS AFTER EXPOSURE.   It is not advised by CDC to get re-tested after receiving a positive COVID test since you can still test positive for weeks to months after you have already cleared the virus.   *If you have not been vaccinated for COVID, I strongly suggest you consider getting vaccinated as long as there are no contraindications.      ED Prescriptions    Medication Sig Dispense Auth. Provider   promethazine-dextromethorphan (PROMETHAZINE-DM) 6.25-15 MG/5ML syrup Take 5 mLs by mouth 4 (four) times daily as needed for cough. 118 mL Danton Clap, PA-C     I have reviewed the PDMP during this encounter.   Danton Clap, PA-C 01/02/21  1132  

## 2021-01-03 LAB — SARS CORONAVIRUS 2 (TAT 6-24 HRS): SARS Coronavirus 2: NEGATIVE

## 2021-01-04 ENCOUNTER — Telehealth: Payer: Self-pay | Admitting: Family Medicine

## 2021-01-04 MED ORDER — PREDNISONE 50 MG PO TABS: ORAL_TABLET | ORAL | 0 refills | Status: DC

## 2021-01-04 MED ORDER — ALBUTEROL SULFATE HFA 108 (90 BASE) MCG/ACT IN AERS: 1.0000 | INHALATION_SPRAY | Freq: Four times a day (QID) | RESPIRATORY_TRACT | 0 refills | Status: DC | PRN

## 2021-01-04 NOTE — Telephone Encounter (Signed)
Sending in Albuterol and Prednisone.  [facility]

## 2021-08-08 ENCOUNTER — Telehealth: Payer: Self-pay | Admitting: Nurse Practitioner

## 2021-08-08 DIAGNOSIS — J029 Acute pharyngitis, unspecified: Secondary | ICD-10-CM

## 2021-08-08 NOTE — Progress Notes (Signed)
  E-Visit for Sore Throat  We are sorry that you are not feeling well.  Here is how we plan to help!  Your symptoms indicate a likely viral infection (Pharyngitis).   Pharyngitis is inflammation in the back of the throat which can cause a sore throat, scratchiness and sometimes difficulty swallowing.   Pharyngitis is typically caused by a respiratory virus and will just run its course.  Please keep in mind that your symptoms could last up to 10 days.  For throat pain, we recommend over the counter oral pain relief medications such as acetaminophen or aspirin, or anti-inflammatory medications such as ibuprofen or naproxen sodium.  Topical treatments such as oral throat lozenges or sprays may be used as needed.  Avoid close contact with loved ones, especially the very young and elderly.  Remember to wash your hands thoroughly throughout the day as this is the number one way to prevent the spread of infection and wipe down door knobs and counters with disinfectant.  After careful review of your answers, I would not recommend and antibiotic for your condition.  Antibiotics should not be used to treat conditions that we suspect are caused by viruses like the virus that causes the common cold or flu. However, some people can have Strep with atypical symptoms. You may need formal testing in clinic or office to confirm if your symptoms continue or worsen.  Providers prescribe antibiotics to treat infections caused by bacteria. Antibiotics are very powerful in treating bacterial infections when they are used properly.  To maintain their effectiveness, they should be used only when necessary.  Overuse of antibiotics has resulted in the development of super bugs that are resistant to treatment!    Home Care: Only take medications as instructed by your medical team. Do not drink alcohol while taking these medications. A steam or ultrasonic humidifier can help congestion.  You can place a towel over your head and  breathe in the steam from hot water coming from a faucet. Avoid close contacts especially the very young and the elderly. Cover your mouth when you cough or sneeze. Always remember to wash your hands.  Get Help Right Away If: You develop worsening fever or throat pain. You develop a severe head ache or visual changes. Your symptoms persist after you have completed your treatment plan.  Make sure you Understand these instructions. Will watch your condition. Will get help right away if you are not doing well or get worse.   Thank you for choosing an e-visit.  Your e-visit answers were reviewed by a board certified advanced clinical practitioner to complete your personal care plan. Depending upon the condition, your plan could have included both over the counter or prescription medications.  Please review your pharmacy choice. Make sure the pharmacy is open so you can pick up prescription now. If there is a problem, you may contact your provider through [employer] and have the prescription routed to another pharmacy.  Your safety is important to [place]. If you have drug allergies check your prescription carefully.   For the next 24 hours you can use MyChart to ask questions about today's visit, request a non-urgent call back, or ask for a work or school excuse. You will get an email in the next two days asking about your experience. I hope that your e-visit has been valuable and will speed your recovery.   I have spent at least 5 minutes reviewing and documenting in the patient's chart.

## 2022-03-11 ENCOUNTER — Other Ambulatory Visit: Payer: Self-pay | Admitting: Emergency Medicine

## 2022-03-11 DIAGNOSIS — R1011 Right upper quadrant pain: Secondary | ICD-10-CM

## 2022-03-21 ENCOUNTER — Ambulatory Visit
Admission: RE | Admit: 2022-03-21 | Discharge: 2022-03-21 | Disposition: A | Discharge status: Home / Self Care | Payer: Self-pay | Source: Ambulatory Visit | Attending: Emergency Medicine | Admitting: Emergency Medicine

## 2022-03-21 DIAGNOSIS — R1011 Right upper quadrant pain: Secondary | ICD-10-CM | POA: Insufficient documentation

## 2022-10-31 ENCOUNTER — Ambulatory Visit: Payer: Self-pay | Admitting: General Surgery

## 2022-10-31 NOTE — H&P (Signed)
PATIENT PROFILE: Evan Stein is a 62 y.o. male who presents to the Clinic for consultation at the request of Evan Laming, NP for evaluation of suspected pilonidal disease.   PCP:  Evan Stein   HISTORY OF PRESENT ILLNESS: Evan Stein reports patient endorses that he has had at least 6 episodes of infection in the lower back between the butt cheeks.  He endorses that it gets really painful when it is swollen.  Pain localized to the perianal area.  No pain radiation.  Pain aggravated by applying pressure.  No alleviating factors.  Patient has never been treated with antibiotic therapy or any surgical intervention.  Patient has had spontaneously drained and drainage by her wife.     PROBLEM LIST: Recurrent perianal abscess   GENERAL REVIEW OF SYSTEMS:    General ROS: negative for - chills, fatigue, fever, weight gain or weight loss Allergy and Immunology ROS: negative for - hives  Hematological and Lymphatic ROS: negative for - bleeding problems or bruising, negative for palpable nodes Endocrine ROS: negative for - heat or cold intolerance, hair changes Respiratory ROS: negative for - cough, shortness of breath or wheezing Cardiovascular ROS: no chest pain or palpitations GI ROS: negative for nausea, vomiting, abdominal pain, diarrhea, constipation Musculoskeletal ROS: negative for - joint swelling or muscle pain Neurological ROS: negative for - confusion, syncope Dermatological ROS: negative for pruritus and rash Psychiatric: negative for anxiety, depression, difficulty sleeping and memory loss   MEDICATIONS: Current Medications  No current outpatient medications on file.    No current facility-administered medications for this visit.        ALLERGIES: Patient has no allergy information on record.   PAST MEDICAL HISTORY: History reviewed. No pertinent past medical history.   PAST SURGICAL HISTORY: History reviewed. No pertinent surgical history.    FAMILY  HISTORY: History reviewed. No pertinent family history.    SOCIAL HISTORY: Social History          Socioeconomic History   Marital status: Married        PHYSICAL EXAM:    Vitals:    10/18/22 1440  BP: 128/80  Pulse: 69    Body mass index is 30.17 kg/m. Weight: (!) 106.6 kg (235 lb)    GENERAL: Alert, active, oriented x3   HEENT: Pupils equal reactive to light. Extraocular movements are intact. Sclera clear. Palpebral conjunctiva normal red color.Pharynx clear.   NECK: Supple with no palpable mass and no adenopathy.   LUNGS: Sound clear with no rales rhonchi or wheezes.   HEART: Regular rhythm S1 and S2 without murmur.   ABDOMEN: Soft and depressible, nontender with no palpable mass, no hepatomegaly.    RECTAL: Patient with a draining abscess at the 10 o'clock position on the left lateral decubitus position.  There is no sacral pits or pilonidal disease.  No hemorrhoids.  Adequate rectal tone.   EXTREMITIES: Well-developed well-nourished symmetrical with no dependent edema.   NEUROLOGICAL: Awake alert oriented, facial expression symmetrical, moving all extremities.   REVIEW OF DATA: I have reviewed the following data today: No results found for any previous visit.      ASSESSMENT: Evan Stein is a 62 y.o. male presenting for consultation for suspected pilonidal disease.   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  There is no concern of pilonidal disease.  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  We discussed about treatment with antibiotic therapy due to acute infection process versus rectal exam under anesthesia with drainage of perianal abscess and trying to locate any anal opening for a perirectal fistula.  At this point the patient will like to try medical intervention.  If he develops recurrent infection despite antibiotic  treatment he would like to contact health for surgical intervention.   As per patient, there has been no improvement with antibiotic therapy. I recommended rectal exam under anesthesia looking for possible abscess to drain and possible fistula   Perianal abscess [K61.0]   PLAN:  Rectal exam under anesthesia, possible fistulotomy, possible seton placement   Patient verbalized understanding, all questions were answered, and were agreeable with the plan outlined above.        Herbert Pun, MD   Electronically signed by Herbert Pun, MD

## 2022-11-05 ENCOUNTER — Ambulatory Visit: Payer: Self-pay | Admitting: Certified Registered"

## 2022-11-05 ENCOUNTER — Ambulatory Visit: Payer: Self-pay | Admitting: Urgent Care

## 2022-11-08 ENCOUNTER — Other Ambulatory Visit: Payer: Self-pay

## 2022-11-08 ENCOUNTER — Encounter
Admission: RE | Admit: 2022-11-08 | Discharge: 2022-11-08 | Disposition: A | Discharge status: Home / Self Care | Payer: Self-pay | Source: Ambulatory Visit | Attending: General Surgery | Admitting: General Surgery

## 2022-11-08 HISTORY — DX: Gastro-esophageal reflux disease without esophagitis: K21.9

## 2022-11-08 NOTE — Patient Instructions (Signed)
Your procedure is scheduled on: Wednesday 11/16/22 To find out your arrival time, please call [phone] between [place] on:   Tuesday 11/15/22 Report to the Registration Desk on the 1st floor of the [facility]. Valet parking is available.  If your arrival time is 6:00 am, do not arrive before that time as the [facility] entrance doors do not open until 6:00 am.  REMEMBER: Instructions that are not followed completely may result in serious medical risk, up to and including death; or upon the discretion of your surgeon and anesthesiologist your surgery may need to be rescheduled.  Do not eat food or drink any liquids after midnight the night before surgery.  No gum chewing or hard candies.  One week prior to surgery: Stop Anti-inflammatories (NSAIDS) such as Advil, Aleve, Ibuprofen, Motrin, Naproxen, Naprosyn and Aspirin based products such as Excedrin, Goody's Powder, BC Powder. You may however, continue to take Tylenol if needed for pain up until the day of surgery.  Stop ANY OVER THE COUNTER supplements until after surgery.  Continue taking all prescribed medications.   TAKE ONLY THESE MEDICATIONS THE MORNING OF SURGERY WITH A SIP OF WATER:  none  No Alcohol for 24 hours before or after surgery.  No Smoking including e-cigarettes for 24 hours before surgery.  No chewable tobacco products for at least 6 hours before surgery.  No nicotine patches on the day of surgery.  Do not use any "recreational" drugs for at least a week (preferably 2 weeks) before your surgery.  Please be advised that the combination of cocaine and anesthesia may have negative outcomes, up to and including death. If you test positive for cocaine, your surgery will be cancelled.  On the morning of surgery brush your teeth with toothpaste and water, you may rinse your mouth with mouthwash if you wish. Do not swallow any toothpaste or mouthwash.  Use CHG Soap or wipes as directed on instruction sheet.  Shower using your regular soap the morning of your procedure.  Do not wear lotions, powders, or perfumes. You can use deodorant.  Do not shave body hair from the neck down 48 hours before surgery.  Wear comfortable clothing (specific to your surgery type) to the hospital.  Do not wear jewelry, make-up, hairpins, clips or nail polish.  Contact lenses, hearing aids and dentures may not be worn into surgery.  Do not bring valuables to the hospital. [facility] is not responsible for any missing/lost belongings or valuables.   Notify your doctor if there is any change in your medical condition (cold, fever, infection).  If you are being discharged the day of surgery, you will not be allowed to drive home. You will need a responsible individual to drive you home and stay with you for 24 hours after surgery.   If you are taking public transportation, you will need to have a responsible individual with you.  If you are being admitted to the hospital overnight, leave your suitcase in the car. After surgery it may be brought to your room.  In case of increased patient census, it may be necessary for you, the patient, to continue your postoperative care in the Same Day Surgery department.  After surgery, you can help prevent lung complications by doing breathing exercises.  Take deep breaths and cough every 1-2 hours. Your doctor may order a device called an Incentive Spirometer to help you take deep breaths. When coughing or sneezing, hold a pillow firmly against your incision with  both hands. This is called "splinting." Doing this helps protect your incision. It also decreases belly discomfort.  Surgery Visitation Policy:  Patients undergoing a surgery or procedure may have two family members or support persons with them as long as the person is not COVID-19 positive or experiencing its symptoms.   Inpatient Visitation:    Visiting hours are 7 a.m. to 8 p.m. Up to four visitors are  allowed at one time in a patient room. The visitors may rotate out with other people during the day. One designated support person (adult) may remain overnight.  Due to an increase in RSV and influenza rates and associated hospitalizations, children ages 25 and under will not be able to visit patients in [facility]. Masks continue to be strongly recommended.  Please call the [place] Dept. at [phone] if you have any questions about these instructions.

## 2022-11-15 MED ORDER — FAMOTIDINE 20 MG PO TABS: 20.0000 mg | ORAL_TABLET | Freq: Once | ORAL | Status: DC

## 2022-11-15 MED ORDER — ORAL CARE MOUTH RINSE: 15.0000 mL | Freq: Once | OROMUCOSAL | Status: DC

## 2022-11-15 MED ORDER — LACTATED RINGERS IV SOLN: INTRAVENOUS | Status: DC

## 2022-11-15 MED ORDER — CEFAZOLIN SODIUM-DEXTROSE 2-4 GM/100ML-% IV SOLN: 2.0000 g | INTRAVENOUS | Status: DC

## 2022-11-15 MED ORDER — CHLORHEXIDINE GLUCONATE 0.12 % MT SOLN: 15.0000 mL | Freq: Once | OROMUCOSAL | Status: DC

## 2022-11-16 ENCOUNTER — Ambulatory Visit
Admission: RE | Admit: 2022-11-16 | Discharge: 2022-11-16 | Disposition: A | Discharge status: Home / Self Care | Payer: Self-pay | Attending: General Surgery | Admitting: General Surgery

## 2022-11-16 ENCOUNTER — Encounter
Admission: RE | Disposition: A | Discharge status: Home / Self Care | Payer: Self-pay | Source: Home / Self Care | Attending: General Surgery

## 2022-11-16 ENCOUNTER — Encounter: Payer: Self-pay | Admitting: General Surgery

## 2022-11-16 ENCOUNTER — Other Ambulatory Visit: Payer: Self-pay

## 2022-11-16 DIAGNOSIS — G4730 Sleep apnea, unspecified: Secondary | ICD-10-CM | POA: Insufficient documentation

## 2022-11-16 DIAGNOSIS — K610 Anal abscess: Secondary | ICD-10-CM | POA: Insufficient documentation

## 2022-11-16 DIAGNOSIS — Z5329 Procedure and treatment not carried out because of patient's decision for other reasons: Secondary | ICD-10-CM | POA: Insufficient documentation

## 2022-11-16 SURGERY — EXAM UNDER ANESTHESIA
Anesthesia: General | Site: Rectum

## 2022-11-16 MED ORDER — CEFAZOLIN SODIUM-DEXTROSE 2-4 GM/100ML-% IV SOLN
INTRAVENOUS | Status: AC
  Filled 2022-11-16: qty 100

## 2022-11-16 MED ORDER — FAMOTIDINE 20 MG PO TABS
ORAL_TABLET | ORAL | Status: AC
  Filled 2022-11-16: qty 1

## 2022-11-16 MED ORDER — CHLORHEXIDINE GLUCONATE 0.12 % MT SOLN
OROMUCOSAL | Status: AC
  Filled 2022-11-16: qty 15

## 2022-11-16 SURGICAL SUPPLY — 38 items
BLADE SURG 15 STRL LF DISP TIS (BLADE) ×1 IMPLANT
BLADE SURG 15 STRL SS (BLADE) ×1
BRIEF MESH DISP 2XL (UNDERPADS AND DIAPERS) ×1 IMPLANT
DRAPE LAPAROTOMY 100X77 ABD (DRAPES) ×1 IMPLANT
DRAPE LEGGINS SURG 28X43 STRL (DRAPES) ×1 IMPLANT
DRAPE UNDER BUTTOCK W/FLU (DRAPES) ×1 IMPLANT
ELECT REM PT RETURN 9FT ADLT (ELECTROSURGICAL) ×1
ELECTRODE REM PT RTRN 9FT ADLT (ELECTROSURGICAL) ×1 IMPLANT
GAUZE 4X4 16PLY ~~LOC~~+RFID DBL (SPONGE) ×1 IMPLANT
GAUZE SPONGE 4X4 12PLY STRL (GAUZE/BANDAGES/DRESSINGS) ×1 IMPLANT
GLOVE BIO SURGEON STRL SZ 6.5 (GLOVE) ×1 IMPLANT
GLOVE BIOGEL PI IND STRL 6.5 (GLOVE) ×1 IMPLANT
GOWN STRL REUS W/ TWL LRG LVL3 (GOWN DISPOSABLE) ×2 IMPLANT
GOWN STRL REUS W/TWL LRG LVL3 (GOWN DISPOSABLE) ×2
HEMOSTAT SURGICEL 2X3 (HEMOSTASIS) IMPLANT
KIT TURNOVER CYSTO (KITS) ×1 IMPLANT
LABEL OR SOLS (LABEL) ×1 IMPLANT
MANIFOLD NEPTUNE II (INSTRUMENTS) ×1 IMPLANT
NEEDLE HYPO 22GX1.5 SAFETY (NEEDLE) ×1 IMPLANT
NS IRRIG 500ML POUR BTL (IV SOLUTION) ×1 IMPLANT
PACK BASIN MINOR ARMC (MISCELLANEOUS) ×1 IMPLANT
PAD ABD DERMACEA PRESS 5X9 (GAUZE/BANDAGES/DRESSINGS) ×1 IMPLANT
PAD PREP 24X41 OB/GYN DISP (PERSONAL CARE ITEMS) ×1 IMPLANT
SHEARS HARMONIC 9CM CVD (BLADE) IMPLANT
SOL PREP PVP 2OZ (MISCELLANEOUS) ×1
SOLUTION PREP PVP 2OZ (MISCELLANEOUS) ×1 IMPLANT
STAPLER PROXIMATE HCS (STAPLE) ×1 IMPLANT
SURGILUBE 2OZ TUBE FLIPTOP (MISCELLANEOUS) ×1 IMPLANT
SUT ETHILON 3-0 FS-10 30 BLK (SUTURE)
SUT VIC AB 2-0 SH 27 (SUTURE) ×1
SUT VIC AB 2-0 SH 27XBRD (SUTURE) ×1 IMPLANT
SUT VIC AB 3-0 SH 27 (SUTURE) ×1
SUT VIC AB 3-0 SH 27X BRD (SUTURE) ×1 IMPLANT
SUTURE EHLN 3-0 FS-10 30 BLK (SUTURE) IMPLANT
SYR 10ML LL (SYRINGE) ×1 IMPLANT
SYR BULB IRRIG 60ML STRL (SYRINGE) ×1 IMPLANT
TRAP FLUID SMOKE EVACUATOR (MISCELLANEOUS) ×1 IMPLANT
WATER STERILE IRR 500ML POUR (IV SOLUTION) ×1 IMPLANT

## 2022-11-16 NOTE — H&P (Signed)
Patient seen today.  Upon examination of rectal exam, there was no concern of infection, no abscess, no drainage, no hemorrhoids.  At this point patient reports that he has been feeling the best that he has felt in 6 months.  He would like to not proceed with surgery at this moment.  He will contact my office as needed.

## 2022-11-16 NOTE — Anesthesia Preprocedure Evaluation (Signed)
Anesthesia Evaluation  Patient identified by MRN, date of birth, ID band Patient awake    Reviewed: Allergy & Precautions, H&P , NPO status , Patient's Chart, lab work & pertinent test results, reviewed documented beta blocker date and time   History of Anesthesia Complications Negative for: history of anesthetic complications  Airway Mallampati: II  TM Distance: >3 FB Neck ROM: full    Dental  (+) Teeth Intact, Chipped, Dental Advidsory Given Bottom two central incisors bonded :   Pulmonary neg shortness of breath, sleep apnea (suggestive history) , neg COPD, neg recent URI   Pulmonary exam normal breath sounds clear to auscultation       Cardiovascular Exercise Tolerance: Good negative cardio ROS Normal cardiovascular exam Rhythm:regular Rate:Normal     Neuro/Psych negative neurological ROS  negative psych ROS   GI/Hepatic negative GI ROS, Neg liver ROS,,,  Endo/Other  negative endocrine ROS    Renal/GU negative Renal ROS  negative genitourinary   Musculoskeletal   Abdominal   Peds  Hematology negative hematology ROS (+)   Anesthesia Other Findings Past Medical History:   Umbilical hernia                                             Reproductive/Obstetrics negative OB ROS                             Anesthesia Physical Anesthesia Plan  ASA: 2  Anesthesia Plan: General   Post-op Pain Management:    Induction: Intravenous  PONV Risk Score and Plan: 2 and Propofol infusion, TIVA and Treatment may vary due to age or medical condition  Airway Management Planned: Natural Airway and Simple Face Mask  Additional Equipment:   Intra-op Plan:   Post-operative Plan:   Informed Consent: I have reviewed the patients History and Physical, chart, labs and discussed the procedure including the risks, benefits and alternatives for the proposed anesthesia with the patient or authorized  representative who has indicated his/her understanding and acceptance.     Dental Advisory Given  Plan Discussed with: Anesthesiologist, CRNA and Surgeon  Anesthesia Plan Comments:         Anesthesia Quick Evaluation

## 2022-12-21 ENCOUNTER — Ambulatory Visit: Payer: Self-pay | Admitting: General Surgery

## 2022-12-21 NOTE — H&P (Signed)
HISTORY OF PRESENT ILLNESS:    Evan Stein is a 62 y.o.male patient who comes for reevaluation of perianal abscess and drainage.  Patient known to my service due to previous perianal abscess that resolved spontaneously.  He was scheduled for exam under anesthesia for possible drainage and seton placement if fistula was identified.  The day of the surgery since everything was resolved and there was no palpable area of concern and the surgery was canceled.  He endorses that he has had at least 2 recurrences since the surgery was canceled.  The drainage has been always on the same spot.  On an lithotomy position, the area of concern will be at the 7 o'clock position.  Denies any fever.  Denies any previous perianal procedures.  Patient denies any significant pain.  No pain radiation.  No alleviating or aggravating factors.      PAST MEDICAL HISTORY:  Medical history reviewed.  No pertinent past medical history     PAST SURGICAL HISTORY:   History reviewed. No pertinent surgical history.       MEDICATIONS:  Outpatient Encounter Medications as of 12/20/2022  Medication Sig Dispense Refill   coenzyme Q10-vitamin E 100-5 mg-unit Cap Take 1 capsule by mouth once daily     cyanocobalamin (VITAMIN B12) 250 MCG tablet Take 250 mcg by mouth once daily     magnesium 250 mg Tab Take 1 tablet by mouth once daily     zinc gluconate 50 mg tablet Take 1 tablet by mouth once daily     No facility-administered encounter medications on file as of 12/20/2022.     ALLERGIES:   Patient has no known allergies.   SOCIAL HISTORY:  Social History   Socioeconomic History   Marital status: Married    FAMILY HISTORY:  No family history on file.   GENERAL REVIEW OF SYSTEMS:   General ROS: negative for - chills, fatigue, fever, weight gain or weight loss Allergy and Immunology ROS: negative for - hives  Hematological and Lymphatic ROS: negative for - bleeding problems or bruising, negative for palpable  nodes Endocrine ROS: negative for - heat or cold intolerance, hair changes Respiratory ROS: negative for - cough, shortness of breath or wheezing Cardiovascular ROS: no chest pain or palpitations GI ROS: negative for nausea, vomiting, abdominal pain, diarrhea, constipation Musculoskeletal ROS: negative for - joint swelling or muscle pain Neurological ROS: negative for - confusion, syncope Dermatological ROS: negative for pruritus and rash  PHYSICAL EXAM:  Vitals:   12/20/22 0810  BP: 132/88  Pulse: 67  .  Ht:188 cm (6' 2.02") Wt:(!) 106.6 kg (235 lb 0.2 oz) AVW:UJWJ surface area is 2.36 meters squared. Body mass index is 30.16 kg/m.Marland Kitchen   GENERAL: Alert, active, oriented x3  HEENT: Pupils equal reactive to light. Extraocular movements are intact. Sclera clear. Palpebral conjunctiva normal red color.Pharynx clear.  NECK: Supple with no palpable mass and no adenopathy.  LUNGS: Sound clear with no rales rhonchi or wheezes.  HEART: Regular rhythm S1 and S2 without murmur.  ABDOMEN: Soft and depressible, nontender with no palpable mass, no hepatomegaly.   RECTAL: There is an opening of a previous collection at the 7 o'clock position on the lithotomy position.  No significant erythema.  No sign of deep abscess today.  Adequate rectal tone.  EXTREMITIES: Well-developed well-nourished symmetrical with no dependent edema.  NEUROLOGICAL: Awake alert oriented, facial expression symmetrical, moving all extremities.      IMPRESSION:     Perianal abscess [K61.0]   -  Continue with recurrent abscesses at the 7 o'clock position on the lithotomy position. -Due to the recurrences I am concerned that this patient did have a perianal fistula. -I recommended again to proceed with rectal exam under anesthesia with possible seton placement versus fistulotomy if fistula is identified. -Patient oriented about the risks of procedure including bleeding, infection, injury to anal sphincter, gas versus  stool incontinence, among others.  The patient reported he understood and agreed to proceed.   PLAN:  Rectal exam under anesthesia with possible seton placement vs fistulotomy (16109, [id number], [id number]) Avoid aspirin 5 days before surgery Contact us if you have any question.   Patient verbalized understanding, all questions were answered, and were agreeable with the plan outlined above.   Carolan Shiver, MD  Electronically signed by Carolan Shiver, MD

## 2022-12-21 NOTE — H&P (View-Only) (Signed)
HISTORY OF PRESENT ILLNESS:    Mr. Evan Stein is a 61 y.o.male patient who comes for reevaluation of perianal abscess and drainage.  Patient known to my service due to previous perianal abscess that resolved spontaneously.  He was scheduled for exam under anesthesia for possible drainage and seton placement if fistula was identified.  The day of the surgery since everything was resolved and there was no palpable area of concern and the surgery was canceled.  He endorses that he has had at least 2 recurrences since the surgery was canceled.  The drainage has been always on the same spot.  On an lithotomy position, the area of concern will be at the 7 o'clock position.  Denies any fever.  Denies any previous perianal procedures.  Patient denies any significant pain.  No pain radiation.  No alleviating or aggravating factors.      PAST MEDICAL HISTORY:  Medical history reviewed.  No pertinent past medical history     PAST SURGICAL HISTORY:   History reviewed. No pertinent surgical history.       MEDICATIONS:  Outpatient Encounter Medications as of 12/20/2022  Medication Sig Dispense Refill   coenzyme Q10-vitamin E 100-5 mg-unit Cap Take 1 capsule by mouth once daily     cyanocobalamin (VITAMIN B12) 250 MCG tablet Take 250 mcg by mouth once daily     magnesium 250 mg Tab Take 1 tablet by mouth once daily     zinc gluconate 50 mg tablet Take 1 tablet by mouth once daily     No facility-administered encounter medications on file as of 12/20/2022.     ALLERGIES:   Patient has no known allergies.   SOCIAL HISTORY:  Social History   Socioeconomic History   Marital status: Married    FAMILY HISTORY:  No family history on file.   GENERAL REVIEW OF SYSTEMS:   General ROS: negative for - chills, fatigue, fever, weight gain or weight loss Allergy and Immunology ROS: negative for - hives  Hematological and Lymphatic ROS: negative for - bleeding problems or bruising, negative for palpable  nodes Endocrine ROS: negative for - heat or cold intolerance, hair changes Respiratory ROS: negative for - cough, shortness of breath or wheezing Cardiovascular ROS: no chest pain or palpitations GI ROS: negative for nausea, vomiting, abdominal pain, diarrhea, constipation Musculoskeletal ROS: negative for - joint swelling or muscle pain Neurological ROS: negative for - confusion, syncope Dermatological ROS: negative for pruritus and rash  PHYSICAL EXAM:  Vitals:   12/20/22 0810  BP: 132/88  Pulse: 67  .  Ht:188 cm (6' 2.02") Wt:(!) 106.6 kg (235 lb 0.2 oz) BSA:Body surface area is 2.36 meters squared. Body mass index is 30.16 kg/m..   GENERAL: Alert, active, oriented x3  HEENT: Pupils equal reactive to light. Extraocular movements are intact. Sclera clear. Palpebral conjunctiva normal red color.Pharynx clear.  NECK: Supple with no palpable mass and no adenopathy.  LUNGS: Sound clear with no rales rhonchi or wheezes.  HEART: Regular rhythm S1 and S2 without murmur.  ABDOMEN: Soft and depressible, nontender with no palpable mass, no hepatomegaly.   RECTAL: There is an opening of a previous collection at the 7 o'clock position on the lithotomy position.  No significant erythema.  No sign of deep abscess today.  Adequate rectal tone.  EXTREMITIES: Well-developed well-nourished symmetrical with no dependent edema.  NEUROLOGICAL: Awake alert oriented, facial expression symmetrical, moving all extremities.      IMPRESSION:     Perianal abscess [K61.0]   -  Continue with recurrent abscesses at the 7 o'clock position on the lithotomy position. -Due to the recurrences I am concerned that this patient did have a perianal fistula. -I recommended again to proceed with rectal exam under anesthesia with possible seton placement versus fistulotomy if fistula is identified. -Patient oriented about the risks of procedure including bleeding, infection, injury to anal sphincter, gas versus  stool incontinence, among others.  The patient reported he understood and agreed to proceed.   PLAN:  Rectal exam under anesthesia with possible seton placement vs fistulotomy (45990, [id number], 46270) Avoid aspirin 5 days before surgery Contact us if you have any question.   Patient verbalized understanding, all questions were answered, and were agreeable with the plan outlined above.   Carmesha Morocco Cintron-Diaz, MD  Electronically signed by Yesennia Hirota Cintron-Diaz, MD  

## 2022-12-23 ENCOUNTER — Encounter
Admission: RE | Admit: 2022-12-23 | Discharge: 2022-12-23 | Disposition: A | Discharge status: Home / Self Care | Payer: Self-pay | Source: Ambulatory Visit | Attending: General Surgery | Admitting: General Surgery

## 2022-12-23 HISTORY — DX: Benign neoplasm of colon, unspecified: D12.6

## 2022-12-23 NOTE — Patient Instructions (Addendum)
Your procedure is scheduled on: Friday, April 26 Report to the Registration Desk on the 1st floor of the [facility] To find out your arrival time, please call [phone] between 1PM - 3PM on: Thursday, April 25 If your arrival time is 6:00 am, do not arrive before that time as the [facility] entrance doors do not open until 6:00 am.  REMEMBER: Instructions that are not followed completely may result in serious medical risk, up to and including death; or upon the discretion of your surgeon and anesthesiologist your surgery may need to be rescheduled.  Do not eat or drink after midnight the night before surgery.  No gum chewing or hard candies.  One week prior to surgery: starting April 19 Stop Anti-inflammatories (NSAIDS) such as Advil, Aleve, Ibuprofen, Motrin, Naproxen, Naprosyn and Aspirin based products such as Excedrin, Goody's Powder, BC Powder. Stop ANY AND ALL OVER THE COUNTER supplements until after surgery. You may however, continue to take Tylenol if needed for pain up until the day of surgery.  DO NOT TAKE ANY MEDICATIONS THE MORNING OF SURGERY  No Alcohol for 24 hours before or after surgery.  No Smoking including e-cigarettes for 24 hours before surgery.  No chewable tobacco products for at least 6 hours before surgery.  No nicotine patches on the day of surgery.  Do not use any "recreational" drugs for at least a week (preferably 2 weeks) before your surgery.  Please be advised that the combination of cocaine and anesthesia may have negative outcomes, up to and including death. If you test positive for cocaine, your surgery will be cancelled.  On the morning of surgery brush your teeth with toothpaste and water, you may rinse your mouth with mouthwash if you wish. Do not swallow any toothpaste or mouthwash.  Do not wear jewelry.  Do not wear lotions, powders, or perfumes.   Do not shave body hair from the neck down 48 hours before surgery.  Contact lenses,  hearing aids and dentures may not be worn into surgery.  Do not bring valuables to the hospital. [facility] is not responsible for any missing/lost belongings or valuables.   Notify your doctor if there is any change in your medical condition (cold, fever, infection).  Wear comfortable clothing (specific to your surgery type) to the hospital.  After surgery, you can help prevent lung complications by doing breathing exercises.  Take deep breaths and cough every 1-2 hours. Your doctor may order a device called an Incentive Spirometer to help you take deep breaths.  If you are being discharged the day of surgery, you will not be allowed to drive home. You will need a responsible individual to drive you home and stay with you for 24 hours after surgery.   If you are taking public transportation, you will need to have a responsible individual with you.  Please call the Pre-admissions Testing Dept. at [phone] if you have any questions about these instructions.  Surgery Visitation Policy:  Patients having surgery or a procedure may have two visitors.  Children under the age of 95 must have an adult with them who is not the patient.

## 2022-12-29 MED ORDER — ORAL CARE MOUTH RINSE: 15.0000 mL | Freq: Once | OROMUCOSAL | Status: AC

## 2022-12-29 MED ORDER — FAMOTIDINE 20 MG PO TABS
20.0000 mg | ORAL_TABLET | Freq: Once | ORAL | Status: AC
  Administered 2022-12-30: 20 mg via ORAL

## 2022-12-29 MED ORDER — LACTATED RINGERS IV SOLN: INTRAVENOUS | Status: DC

## 2022-12-29 MED ORDER — METRONIDAZOLE 500 MG/100ML IV SOLN
500.0000 mg | INTRAVENOUS | Status: AC
  Administered 2022-12-30: 500 mg via INTRAVENOUS
  Filled 2022-12-29 (×2): qty 100

## 2022-12-29 MED ORDER — CHLORHEXIDINE GLUCONATE 0.12 % MT SOLN
15.0000 mL | Freq: Once | OROMUCOSAL | Status: AC
  Administered 2022-12-30: 15 mL via OROMUCOSAL

## 2022-12-29 MED ORDER — CIPROFLOXACIN IN D5W 400 MG/200ML IV SOLN
400.0000 mg | INTRAVENOUS | Status: AC
  Administered 2022-12-30: 400 mg via INTRAVENOUS

## 2022-12-30 ENCOUNTER — Ambulatory Visit: Payer: Self-pay | Admitting: Certified Registered"

## 2022-12-30 ENCOUNTER — Encounter: Payer: Self-pay | Admitting: General Surgery

## 2022-12-30 ENCOUNTER — Ambulatory Visit
Admission: RE | Admit: 2022-12-30 | Discharge: 2022-12-30 | Disposition: A | Discharge status: Home / Self Care | Payer: Self-pay | Attending: General Surgery | Admitting: General Surgery

## 2022-12-30 ENCOUNTER — Other Ambulatory Visit: Payer: Self-pay

## 2022-12-30 ENCOUNTER — Encounter
Admission: RE | Disposition: A | Discharge status: Home / Self Care | Payer: Self-pay | Source: Home / Self Care | Attending: General Surgery

## 2022-12-30 DIAGNOSIS — K219 Gastro-esophageal reflux disease without esophagitis: Secondary | ICD-10-CM | POA: Insufficient documentation

## 2022-12-30 DIAGNOSIS — K610 Anal abscess: Secondary | ICD-10-CM | POA: Insufficient documentation

## 2022-12-30 HISTORY — PX: RECTAL EXAM UNDER ANESTHESIA: SHX6399

## 2022-12-30 SURGERY — EXAM UNDER ANESTHESIA, RECTUM
Anesthesia: General | Site: Rectum

## 2022-12-30 MED ORDER — DEXMEDETOMIDINE HCL IN NACL 80 MCG/20ML IV SOLN
INTRAVENOUS | Status: DC | PRN
  Administered 2022-12-30: 12 ug via INTRAVENOUS

## 2022-12-30 MED ORDER — FENTANYL CITRATE (PF) 100 MCG/2ML IJ SOLN: 25.0000 ug | INTRAMUSCULAR | Status: DC | PRN

## 2022-12-30 MED ORDER — KETAMINE HCL 50 MG/5ML IJ SOSY
PREFILLED_SYRINGE | INTRAMUSCULAR | Status: AC
  Filled 2022-12-30: qty 5

## 2022-12-30 MED ORDER — MIDAZOLAM HCL 2 MG/2ML IJ SOLN
INTRAMUSCULAR | Status: AC
  Filled 2022-12-30: qty 2

## 2022-12-30 MED ORDER — FENTANYL CITRATE (PF) 100 MCG/2ML IJ SOLN
INTRAMUSCULAR | Status: AC
  Filled 2022-12-30: qty 2

## 2022-12-30 MED ORDER — GLYCOPYRROLATE 0.2 MG/ML IJ SOLN
INTRAMUSCULAR | Status: DC | PRN
  Administered 2022-12-30: .2 mg via INTRAVENOUS

## 2022-12-30 MED ORDER — BUPIVACAINE HCL (PF) 0.5 % IJ SOLN
INTRAMUSCULAR | Status: AC
  Filled 2022-12-30: qty 30

## 2022-12-30 MED ORDER — PROPOFOL 500 MG/50ML IV EMUL
INTRAVENOUS | Status: DC | PRN
  Administered 2022-12-30: 150 ug/kg/min via INTRAVENOUS

## 2022-12-30 MED ORDER — FAMOTIDINE 20 MG PO TABS
ORAL_TABLET | ORAL | Status: AC
  Filled 2022-12-30: qty 1

## 2022-12-30 MED ORDER — ONDANSETRON HCL 4 MG/2ML IJ SOLN
INTRAMUSCULAR | Status: DC | PRN
  Administered 2022-12-30: 4 mg via INTRAVENOUS

## 2022-12-30 MED ORDER — CIPROFLOXACIN IN D5W 400 MG/200ML IV SOLN
INTRAVENOUS | Status: AC
  Filled 2022-12-30: qty 200

## 2022-12-30 MED ORDER — ACETAMINOPHEN 10 MG/ML IV SOLN: 1000.0000 mg | Freq: Once | INTRAVENOUS | Status: DC | PRN

## 2022-12-30 MED ORDER — CHLORHEXIDINE GLUCONATE 0.12 % MT SOLN
OROMUCOSAL | Status: AC
  Filled 2022-12-30: qty 15

## 2022-12-30 MED ORDER — ACETAMINOPHEN 10 MG/ML IV SOLN
INTRAVENOUS | Status: DC | PRN
  Administered 2022-12-30: 1000 mg via INTRAVENOUS

## 2022-12-30 MED ORDER — MIDAZOLAM HCL 2 MG/2ML IJ SOLN
INTRAMUSCULAR | Status: DC | PRN
  Administered 2022-12-30: 2 mg via INTRAVENOUS

## 2022-12-30 MED ORDER — BUPIVACAINE LIPOSOME 1.3 % IJ SUSP
INTRAMUSCULAR | Status: DC | PRN
  Administered 2022-12-30: 10 mL

## 2022-12-30 MED ORDER — KETAMINE HCL 10 MG/ML IJ SOLN
INTRAMUSCULAR | Status: DC | PRN
  Administered 2022-12-30: 20 mg via INTRAVENOUS
  Administered 2022-12-30: 10 mg via INTRAVENOUS

## 2022-12-30 MED ORDER — PROPOFOL 10 MG/ML IV BOLUS
INTRAVENOUS | Status: AC
  Filled 2022-12-30: qty 20

## 2022-12-30 MED ORDER — BUPIVACAINE LIPOSOME 1.3 % IJ SUSP
INTRAMUSCULAR | Status: AC
  Filled 2022-12-30: qty 10

## 2022-12-30 MED ORDER — BUPIVACAINE HCL (PF) 0.5 % IJ SOLN
INTRAMUSCULAR | Status: DC | PRN
  Administered 2022-12-30: 30 mL

## 2022-12-30 MED ORDER — OXYCODONE HCL 5 MG/5ML PO SOLN: 5.0000 mg | Freq: Once | ORAL | Status: DC | PRN

## 2022-12-30 MED ORDER — FENTANYL CITRATE (PF) 100 MCG/2ML IJ SOLN
INTRAMUSCULAR | Status: DC | PRN
  Administered 2022-12-30: 25 ug via INTRAVENOUS

## 2022-12-30 MED ORDER — OXYCODONE HCL 5 MG PO TABS: 5.0000 mg | ORAL_TABLET | Freq: Once | ORAL | Status: DC | PRN

## 2022-12-30 MED ORDER — ONDANSETRON HCL 4 MG/2ML IJ SOLN: 4.0000 mg | Freq: Once | INTRAMUSCULAR | Status: DC | PRN

## 2022-12-30 MED ORDER — BUPIVACAINE LIPOSOME 1.3 % IJ SUSP
INTRAMUSCULAR | Status: AC
  Filled 2022-12-30: qty 20

## 2022-12-30 MED ORDER — OXYCODONE-ACETAMINOPHEN 5-325 MG PO TABS: 1.0000 | ORAL_TABLET | ORAL | 0 refills | Status: AC | PRN

## 2022-12-30 SURGICAL SUPPLY — 39 items
BLADE SURG 15 STRL LF DISP TIS (BLADE) ×1 IMPLANT
BLADE SURG 15 STRL SS (BLADE) ×1
BRIEF MESH DISP 2XL (UNDERPADS AND DIAPERS) ×1 IMPLANT
DRAPE LAPAROTOMY 100X77 ABD (DRAPES) ×1 IMPLANT
DRAPE LEGGINS SURG 28X43 STRL (DRAPES) ×1 IMPLANT
DRAPE UNDER BUTTOCK W/FLU (DRAPES) ×1 IMPLANT
ELECT REM PT RETURN 9FT ADLT (ELECTROSURGICAL) ×1
ELECTRODE REM PT RTRN 9FT ADLT (ELECTROSURGICAL) ×1 IMPLANT
GAUZE 4X4 16PLY ~~LOC~~+RFID DBL (SPONGE) ×1 IMPLANT
GAUZE SPONGE 4X4 12PLY STRL (GAUZE/BANDAGES/DRESSINGS) ×1 IMPLANT
GLOVE BIO SURGEON STRL SZ 6.5 (GLOVE) ×1 IMPLANT
GLOVE BIOGEL PI IND STRL 6.5 (GLOVE) ×1 IMPLANT
GOWN STRL REUS W/ TWL LRG LVL3 (GOWN DISPOSABLE) ×2 IMPLANT
GOWN STRL REUS W/TWL LRG LVL3 (GOWN DISPOSABLE) ×2
HEMOSTAT SURGICEL 2X3 (HEMOSTASIS) IMPLANT
KIT TURNOVER CYSTO (KITS) ×1 IMPLANT
LABEL OR SOLS (LABEL) ×1 IMPLANT
MANIFOLD NEPTUNE II (INSTRUMENTS) ×1 IMPLANT
NDL HYPO 22X1.5 SAFETY MO (MISCELLANEOUS) ×1 IMPLANT
NEEDLE HYPO 22X1.5 SAFETY MO (MISCELLANEOUS) ×1 IMPLANT
NS IRRIG 500ML POUR BTL (IV SOLUTION) ×1 IMPLANT
PACK BASIN MINOR ARMC (MISCELLANEOUS) ×1 IMPLANT
PAD ABD DERMACEA PRESS 5X9 (GAUZE/BANDAGES/DRESSINGS) ×1 IMPLANT
PAD PREP OB/GYN DISP 24X41 (PERSONAL CARE ITEMS) ×1 IMPLANT
SHEARS HARMONIC 9CM CVD (BLADE) IMPLANT
SOL PREP PVP 2OZ (MISCELLANEOUS) ×1
SOLUTION PREP PVP 2OZ (MISCELLANEOUS) ×1 IMPLANT
STAPLER PROXIMATE HCS (STAPLE) ×1
SURGILUBE 2OZ TUBE FLIPTOP (MISCELLANEOUS) ×1 IMPLANT
SUT ETHILON 3-0 FS-10 30 BLK (SUTURE)
SUT VIC AB 2-0 SH 27 (SUTURE)
SUT VIC AB 2-0 SH 27XBRD (SUTURE) ×1 IMPLANT
SUT VIC AB 3-0 SH 27 (SUTURE)
SUT VIC AB 3-0 SH 27X BRD (SUTURE) ×1 IMPLANT
SUTURE EHLN 3-0 FS-10 30 BLK (SUTURE) IMPLANT
SYR 10ML LL (SYRINGE) ×1 IMPLANT
SYR BULB IRRIG 60ML STRL (SYRINGE) ×1 IMPLANT
TRAP FLUID SMOKE EVACUATOR (MISCELLANEOUS) ×1 IMPLANT
WATER STERILE IRR 500ML POUR (IV SOLUTION) ×1 IMPLANT

## 2022-12-30 NOTE — Op Note (Signed)
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 Hazle Quant  Anesthesia: General and local  Wound classification: Clean Contaminated  Indications: Patient is a 62 y.o. male was found to have recurrent perianal infections.  Fistula suspected.  Findings: 1.  90,000 position.  External fistula identified at the 7 o'clock position.  No abscess. 2. Internal and external anal sphincter identified and preserved 3. Adequate hemostasis  Description of procedure: The patient was brought to the operating room and general was induced. Patient was placed in the lithotomy position. A time-out was completed verifying correct patient, procedure, site, positioning, and implant(s) and/or special equipment prior to beginning this procedure. The buttocks were taped apart.  The perineum was prepped and draped in standard sterile fashion. Local anesthetic was injected as a perianal block. An anoscope was introduced and the three hemorrhoidal pedicles were identified.  Initially no internal opening was identified.  The external opening was interrogated.  A superficial, short fistula tract was identified.  Fistulotomy was performed.  No abscess was identified.  Chronic induration tissue was debrided with curette.  Hemostasis was achieved with electrocautery.  Anal sphincter preserved.  The wound was left open and packed with wet-to-dry dressing.  The patient tolerated the procedure well and was taken to the postanesthesia care unit in stable condition.   Specimen: None  Complications: none  EBL: Minimal

## 2022-12-30 NOTE — Anesthesia Procedure Notes (Signed)
Procedure Name: MAC Date/Time: 12/30/2022 7:40 AM  Performed by: Cheral Bay, CRNAPre-anesthesia Checklist: Patient identified, Emergency Drugs available, Suction available, Patient being monitored and Timeout performed Patient Re-evaluated:Patient Re-evaluated prior to induction Oxygen Delivery Method: Non-rebreather mask and Simple face mask Induction Type: IV induction Placement Confirmation: positive ETCO2 and CO2 detector

## 2022-12-30 NOTE — Discharge Instructions (Addendum)
  Diet: Resume home heart healthy regular diet.   Activity: Increase activity as tolerated. Llight activity and walking are encouraged. Do not drive or drink alcohol if taking narcotic pain medications.  Wound care: May shower with soapy water and pat dry (do not rub incisions), but no baths or submerging incision underwater until follow-up. (no swimming)   Continue wet to dry dressings as instructed. Changes dressing at least one time per day.   Medications: Resume all home medications. For mild to moderate pain: acetaminophen (Tylenol) or ibuprofen (if no kidney disease). Combining Tylenol with alcohol can substantially increase your risk of causing liver disease. Narcotic pain medications, if prescribed, can be used for severe pain, though may cause nausea, constipation, and drowsiness.  If you do not need the narcotic pain medication, you do not need to fill the prescription.  May use RectiCare, which you can get over the counter for peri anal pain.   Call office [phone]) at any time if any questions, worsening pain, fevers/chills, bleeding, drainage from incision site, or other concerns.   AMBULATORY SURGERY  DISCHARGE INSTRUCTIONS   The drugs that you were given will stay in your system until tomorrow so for the next 24 hours you should not:  Drive an automobile Make any legal decisions Drink any alcoholic beverage   You may resume regular meals tomorrow.  Today it is better to start with liquids and gradually work up to solid foods.  You may eat anything you prefer, but it is better to start with liquids, then soup and crackers, and gradually work up to solid foods.   Please notify your doctor immediately if you have any unusual bleeding, trouble breathing, redness and pain at the surgery site, drainage, fever, or pain not relieved by medication.    Your post-operative visit with Dr.                                       is: Date:                        Time:     Please call to schedule your post-operative visit.  Additional Instructions: PLEASE KEEP GREEN/TEAL BRACELET ON FOR 4 DAYS

## 2022-12-30 NOTE — Anesthesia Preprocedure Evaluation (Signed)
Anesthesia Evaluation  Patient identified by MRN, date of birth, ID band Patient awake    Reviewed: Allergy & Precautions, NPO status , Patient's Chart, lab work & pertinent test results  History of Anesthesia Complications Negative for: history of anesthetic complications  Airway Mallampati: II  TM Distance: >3 FB Neck ROM: Full    Dental  (+) Teeth Intact   Pulmonary neg pulmonary ROS, neg sleep apnea, neg COPD, Patient abstained from smoking.Not current smoker   Pulmonary exam normal breath sounds clear to auscultation       Cardiovascular Exercise Tolerance: Good METShypertension, Pt. on medications (-) CAD and (-) Past MI (-) dysrhythmias  Rhythm:Regular Rate:Normal - Systolic murmurs    Neuro/Psych negative neurological ROS  negative psych ROS   GI/Hepatic ,GERD  Controlled,,(+)     (-) substance abuse    Endo/Other  neg diabetes    Renal/GU negative Renal ROS     Musculoskeletal   Abdominal   Peds  Hematology   Anesthesia Other Findings Past Medical History: No date: Benign neoplasm of colon No date: GERD (gastroesophageal reflux disease) No date: Hypertension     Comment:  WITH MEDICAL PROCEDURES white coat No date: Umbilical hernia  Reproductive/Obstetrics                             Anesthesia Physical Anesthesia Plan  ASA: 2  Anesthesia Plan: General   Post-op Pain Management: Minimal or no pain anticipated   Induction: Intravenous  PONV Risk Score and Plan: 2 and Propofol infusion, TIVA, Ondansetron and Midazolam  Airway Management Planned: Nasal Cannula  Additional Equipment: None  Intra-op Plan:   Post-operative Plan:   Informed Consent: I have reviewed the patients History and Physical, chart, labs and discussed the procedure including the risks, benefits and alternatives for the proposed anesthesia with the patient or authorized representative who has  indicated his/her understanding and acceptance.     Dental advisory given  Plan Discussed with: CRNA and Surgeon  Anesthesia Plan Comments: (Discussed risks of anesthesia with patient, including possibility of difficulty with spontaneous ventilation under anesthesia necessitating airway intervention, PONV, and rare risks such as cardiac or respiratory or neurological events, and allergic reactions. Discussed the role of CRNA in patient's perioperative care. Patient understands.)       Anesthesia Quick Evaluation

## 2022-12-30 NOTE — Interval H&P Note (Signed)
History and Physical Interval Note:  12/30/2022 6:51 AM  Evan Stein  has presented today for surgery, with the diagnosis of K61.0 Perianal abscess.  The various methods of treatment have been discussed with the patient and family. After consideration of risks, benefits and other options for treatment, the patient has consented to  Procedure(s): RECTAL EXAM UNDER ANESTHESIA -- possible seton / fistulotomy (N/A) as a surgical intervention.  The patient's history has been reviewed, patient examined, no change in status, stable for surgery.  I have reviewed the patient's chart and labs.  Questions were answered to the patient's satisfaction.     Carolan Shiver

## 2022-12-30 NOTE — Transfer of Care (Signed)
Immediate Anesthesia Transfer of Care Note  Patient: Evan Stein  Procedure(s) Performed: RECTAL EXAM UNDER ANESTHESIA -- possible seton / fistulotomy (Rectum)  Patient Location: PACU  Anesthesia Type:General  Level of Consciousness: sedated  Airway & Oxygen Therapy: Patient Spontanous Breathing and Patient connected to face mask oxygen  Post-op Assessment: Report given to RN and Post -op Vital signs reviewed and stable  Post vital signs: Reviewed and stable  Last Vitals:  Vitals Value Taken Time  BP 118/81 12/30/22 0815  Temp    Pulse 61 12/30/22 0816  Resp 13 12/30/22 0816  SpO2 98 % 12/30/22 0816  Vitals shown include unvalidated device data.  Last Pain:  Vitals:   12/30/22 0610  TempSrc: Temporal  PainSc: 0-No pain         Complications: No notable events documented.

## 2022-12-30 NOTE — Anesthesia Postprocedure Evaluation (Signed)
Anesthesia Post Note  Patient: Evan Stein  Procedure(s) Performed: RECTAL EXAM UNDER ANESTHESIA -- possible seton / fistulotomy (Rectum)  Patient location during evaluation: PACU Anesthesia Type: General Level of consciousness: awake and alert Pain management: pain level controlled Vital Signs Assessment: post-procedure vital signs reviewed and stable Respiratory status: spontaneous breathing, nonlabored ventilation, respiratory function stable and patient connected to nasal cannula oxygen Cardiovascular status: blood pressure returned to baseline and stable Postop Assessment: no apparent nausea or vomiting Anesthetic complications: no   No notable events documented.   Last Vitals:  Vitals:   12/30/22 0845 12/30/22 0855  BP: 129/85 135/86  Pulse: (!) 55 (!) 53  Resp: 15 18  Temp: (!) 36.1 C (!) 36.2 C  SpO2: 96% 98%    Last Pain:  Vitals:   12/30/22 0855  TempSrc: Temporal  PainSc: 0-No pain                 Corinda Gubler

## 2023-02-13 ENCOUNTER — Encounter: Payer: Self-pay | Admitting: General Surgery

## 2023-09-21 ENCOUNTER — Telehealth: Payer: Self-pay | Admitting: Family Medicine

## 2023-09-21 DIAGNOSIS — B9689 Other specified bacterial agents as the cause of diseases classified elsewhere: Secondary | ICD-10-CM

## 2023-09-21 DIAGNOSIS — J019 Acute sinusitis, unspecified: Secondary | ICD-10-CM

## 2023-09-21 MED ORDER — AMOXICILLIN-POT CLAVULANATE 875-125 MG PO TABS
1.0000 | ORAL_TABLET | Freq: Two times a day (BID) | ORAL | 0 refills | Status: AC
Start: 2023-09-21 — End: 2023-09-28

## 2023-09-21 NOTE — Progress Notes (Signed)
